# Patient Record
Sex: Female | Born: 1955 | Race: Black or African American | Hispanic: No | Marital: Single | Smoking: Former smoker
Health system: Southern US, Community
[De-identification: ages and names within clinical notes are randomized; demographics above are authoritative.]

## PROBLEM LIST (undated history)

## (undated) DIAGNOSIS — M549 Dorsalgia, unspecified: Secondary | ICD-10-CM

## (undated) DIAGNOSIS — F1990 Other psychoactive substance use, unspecified, uncomplicated: Secondary | ICD-10-CM

## (undated) DIAGNOSIS — F32A Depression, unspecified: Secondary | ICD-10-CM

## (undated) DIAGNOSIS — Z720 Tobacco use: Secondary | ICD-10-CM

## (undated) DIAGNOSIS — F419 Anxiety disorder, unspecified: Secondary | ICD-10-CM

## (undated) DIAGNOSIS — I10 Essential (primary) hypertension: Secondary | ICD-10-CM

## (undated) DIAGNOSIS — J449 Chronic obstructive pulmonary disease, unspecified: Secondary | ICD-10-CM

## (undated) DIAGNOSIS — M2550 Pain in unspecified joint: Secondary | ICD-10-CM

## (undated) DIAGNOSIS — E119 Type 2 diabetes mellitus without complications: Secondary | ICD-10-CM

## (undated) HISTORY — DX: Tobacco use: Z72.0

## (undated) HISTORY — DX: Anxiety disorder, unspecified: F41.9

## (undated) HISTORY — DX: Depression, unspecified: F32.A

## (undated) HISTORY — DX: Chronic obstructive pulmonary disease, unspecified: J44.9

## (undated) HISTORY — PX: CHOLECYSTECTOMY: SHX55

## (undated) HISTORY — DX: Other psychoactive substance use, unspecified, uncomplicated: F19.90

## (undated) HISTORY — PX: VAGINAL HYSTERECTOMY: SUR661

## (undated) HISTORY — DX: Type 2 diabetes mellitus without complications: E11.9

## (undated) HISTORY — DX: Pain in unspecified joint: M25.50

## (undated) HISTORY — DX: Essential (primary) hypertension: I10

## (undated) HISTORY — DX: Dorsalgia, unspecified: M54.9

## (undated) HISTORY — DX: Morbid (severe) obesity due to excess calories: E66.01

---

## 2005-03-09 ENCOUNTER — Ambulatory Visit: Payer: Self-pay | Admitting: Family Medicine

## 2005-03-09 ENCOUNTER — Inpatient Hospital Stay: Payer: Self-pay | Admitting: General Surgery

## 2005-03-15 ENCOUNTER — Ambulatory Visit: Payer: Self-pay | Admitting: General Surgery

## 2006-04-22 ENCOUNTER — Emergency Department: Payer: Self-pay | Admitting: Emergency Medicine

## 2006-09-03 ENCOUNTER — Emergency Department: Payer: Self-pay | Admitting: Emergency Medicine

## 2008-03-06 ENCOUNTER — Emergency Department: Payer: Self-pay | Admitting: Emergency Medicine

## 2009-01-01 ENCOUNTER — Inpatient Hospital Stay: Payer: Self-pay | Admitting: Internal Medicine

## 2009-01-15 ENCOUNTER — Emergency Department: Payer: Self-pay | Admitting: Emergency Medicine

## 2009-03-04 ENCOUNTER — Emergency Department: Payer: Self-pay | Admitting: Internal Medicine

## 2009-03-28 ENCOUNTER — Inpatient Hospital Stay: Payer: Self-pay | Admitting: Internal Medicine

## 2010-02-10 ENCOUNTER — Emergency Department: Payer: Self-pay | Admitting: Internal Medicine

## 2010-03-10 ENCOUNTER — Emergency Department: Payer: Self-pay | Admitting: Emergency Medicine

## 2013-08-17 ENCOUNTER — Emergency Department: Payer: Self-pay | Admitting: Emergency Medicine

## 2013-08-28 ENCOUNTER — Emergency Department: Payer: Self-pay | Admitting: Emergency Medicine

## 2013-08-28 LAB — CBC
HCT: 44.8 % (ref 35.0–47.0)
HGB: 14.4 g/dL (ref 12.0–16.0)
MCH: 30.2 pg (ref 26.0–34.0)
MCHC: 32.3 g/dL (ref 32.0–36.0)
MCV: 94 fL (ref 80–100)
PLATELETS: 359 10*3/uL (ref 150–440)
RBC: 4.79 10*6/uL (ref 3.80–5.20)
RDW: 14.3 % (ref 11.5–14.5)
WBC: 7.9 10*3/uL (ref 3.6–11.0)

## 2013-08-28 LAB — BASIC METABOLIC PANEL
Anion Gap: 7 (ref 7–16)
BUN: 7 mg/dL (ref 7–18)
CO2: 28 mmol/L (ref 21–32)
CREATININE: 1 mg/dL (ref 0.60–1.30)
Calcium, Total: 9 mg/dL (ref 8.5–10.1)
Chloride: 102 mmol/L (ref 98–107)
GLUCOSE: 184 mg/dL — AB (ref 65–99)
Osmolality: 277 (ref 275–301)
Potassium: 3.7 mmol/L (ref 3.5–5.1)
Sodium: 137 mmol/L (ref 136–145)

## 2013-08-28 LAB — TROPONIN I

## 2013-12-28 ENCOUNTER — Inpatient Hospital Stay: Payer: Self-pay | Admitting: Internal Medicine

## 2013-12-28 LAB — CBC WITH DIFFERENTIAL/PLATELET
Basophil #: 0.1 10*3/uL (ref 0.0–0.1)
Basophil %: 0.4 %
EOS PCT: 0 %
Eosinophil #: 0 10*3/uL (ref 0.0–0.7)
HCT: 43.2 % (ref 35.0–47.0)
HGB: 14.1 g/dL (ref 12.0–16.0)
Lymphocyte #: 1.2 10*3/uL (ref 1.0–3.6)
Lymphocyte %: 6.8 %
MCH: 29.6 pg (ref 26.0–34.0)
MCHC: 32.7 g/dL (ref 32.0–36.0)
MCV: 90 fL (ref 80–100)
MONO ABS: 0.9 x10 3/mm (ref 0.2–0.9)
MONOS PCT: 4.8 %
Neutrophil #: 15.6 10*3/uL — ABNORMAL HIGH (ref 1.4–6.5)
Neutrophil %: 88 %
Platelet: 299 10*3/uL (ref 150–440)
RBC: 4.78 10*6/uL (ref 3.80–5.20)
RDW: 13.7 % (ref 11.5–14.5)
WBC: 17.8 10*3/uL — ABNORMAL HIGH (ref 3.6–11.0)

## 2013-12-28 LAB — URINALYSIS, COMPLETE
Bilirubin,UR: NEGATIVE
Glucose,UR: >=500 mg/dL (ref 0–75)
LEUKOCYTE ESTERASE: NEGATIVE
NITRITE: NEGATIVE
Ph: 6 (ref 4.5–8.0)
Protein: 100
RBC,UR: 5 /HPF (ref 0–5)
Specific Gravity: 1.028 (ref 1.003–1.030)
Squamous Epithelial: 1
WBC UR: 5 /HPF (ref 0–5)

## 2013-12-28 LAB — MAGNESIUM: Magnesium: 1.8 mg/dL

## 2013-12-28 LAB — COMPREHENSIVE METABOLIC PANEL
ALK PHOS: 86 U/L
AST: 94 U/L — AB (ref 15–37)
Albumin: 2.3 g/dL — ABNORMAL LOW (ref 3.4–5.0)
Anion Gap: 8 (ref 7–16)
BUN: 23 mg/dL — ABNORMAL HIGH (ref 7–18)
Bilirubin,Total: 0.6 mg/dL (ref 0.2–1.0)
CO2: 28 mmol/L (ref 21–32)
CREATININE: 1.32 mg/dL — AB (ref 0.60–1.30)
Calcium, Total: 8.2 mg/dL — ABNORMAL LOW (ref 8.5–10.1)
Chloride: 101 mmol/L (ref 98–107)
EGFR (African American): 53 — ABNORMAL LOW
GFR CALC NON AF AMER: 44 — AB
GLUCOSE: 334 mg/dL — AB (ref 65–99)
Osmolality: 291 (ref 275–301)
Potassium: 3.4 mmol/L — ABNORMAL LOW (ref 3.5–5.1)
SGPT (ALT): 64 U/L — ABNORMAL HIGH
Sodium: 137 mmol/L (ref 136–145)
TOTAL PROTEIN: 7.1 g/dL (ref 6.4–8.2)

## 2013-12-28 LAB — PHOSPHORUS: Phosphorus: 1.3 mg/dL — ABNORMAL LOW (ref 2.5–4.9)

## 2013-12-28 LAB — TROPONIN I: Troponin-I: 0.07 ng/mL — ABNORMAL HIGH

## 2013-12-28 LAB — PROTIME-INR
INR: 1.2
Prothrombin Time: 14.8 secs — ABNORMAL HIGH (ref 11.5–14.7)

## 2013-12-29 ENCOUNTER — Encounter: Payer: Self-pay | Admitting: Nurse Practitioner

## 2013-12-29 ENCOUNTER — Other Ambulatory Visit: Payer: Self-pay | Admitting: Nurse Practitioner

## 2013-12-29 DIAGNOSIS — I10 Essential (primary) hypertension: Secondary | ICD-10-CM

## 2013-12-29 DIAGNOSIS — R7989 Other specified abnormal findings of blood chemistry: Secondary | ICD-10-CM

## 2013-12-29 LAB — HEPATIC FUNCTION PANEL A (ARMC)
ALT: 91 U/L — AB
Albumin: 2 g/dL — ABNORMAL LOW (ref 3.4–5.0)
Alkaline Phosphatase: 77 U/L
BILIRUBIN DIRECT: 0.2 mg/dL (ref 0.0–0.2)
Bilirubin,Total: 0.4 mg/dL (ref 0.2–1.0)
SGOT(AST): 168 U/L — ABNORMAL HIGH (ref 15–37)
Total Protein: 6.3 g/dL — ABNORMAL LOW (ref 6.4–8.2)

## 2013-12-29 LAB — TROPONIN I
Troponin-I: 0.08 ng/mL — ABNORMAL HIGH
Troponin-I: 0.06 ng/mL — ABNORMAL HIGH

## 2013-12-29 LAB — BASIC METABOLIC PANEL
Anion Gap: 8 (ref 7–16)
BUN: 23 mg/dL — ABNORMAL HIGH (ref 7–18)
CALCIUM: 7.1 mg/dL — AB (ref 8.5–10.1)
CREATININE: 1.24 mg/dL (ref 0.60–1.30)
Chloride: 105 mmol/L (ref 98–107)
Co2: 27 mmol/L (ref 21–32)
EGFR (African American): 57 — ABNORMAL LOW
EGFR (Non-African Amer.): 47 — ABNORMAL LOW
GLUCOSE: 342 mg/dL — AB (ref 65–99)
Osmolality: 297 (ref 275–301)
Potassium: 3.9 mmol/L (ref 3.5–5.1)
SODIUM: 140 mmol/L (ref 136–145)

## 2013-12-29 LAB — CBC WITH DIFFERENTIAL/PLATELET
Basophil #: 0.1 10*3/uL (ref 0.0–0.1)
Basophil %: 0.9 %
Eosinophil #: 0.1 10*3/uL (ref 0.0–0.7)
Eosinophil %: 0.4 %
HCT: 38.4 % (ref 35.0–47.0)
HGB: 12.8 g/dL (ref 12.0–16.0)
Lymphocyte #: 0.8 10*3/uL — ABNORMAL LOW (ref 1.0–3.6)
Lymphocyte %: 4.6 %
MCH: 30.4 pg (ref 26.0–34.0)
MCHC: 33.3 g/dL (ref 32.0–36.0)
MCV: 91 fL (ref 80–100)
MONOS PCT: 3.6 %
Monocyte #: 0.6 x10 3/mm (ref 0.2–0.9)
NEUTROS ABS: 15.2 10*3/uL — AB (ref 1.4–6.5)
Neutrophil %: 90.5 %
Platelet: 261 10*3/uL (ref 150–440)
RBC: 4.2 10*6/uL (ref 3.80–5.20)
RDW: 13.7 % (ref 11.5–14.5)
WBC: 16.8 10*3/uL — ABNORMAL HIGH (ref 3.6–11.0)

## 2013-12-29 LAB — AMMONIA: AMMONIA, PLASMA: 36 umol/L — AB (ref 11–32)

## 2013-12-30 DIAGNOSIS — R0602 Shortness of breath: Secondary | ICD-10-CM

## 2013-12-30 DIAGNOSIS — R7989 Other specified abnormal findings of blood chemistry: Secondary | ICD-10-CM

## 2013-12-30 LAB — CBC WITH DIFFERENTIAL/PLATELET
Basophil #: 0.1 10*3/uL (ref 0.0–0.1)
Basophil %: 0.3 %
Eosinophil #: 0 10*3/uL (ref 0.0–0.7)
Eosinophil %: 0.1 %
HCT: 38.9 % (ref 35.0–47.0)
HGB: 12.7 g/dL (ref 12.0–16.0)
Lymphocyte #: 0.9 10*3/uL — ABNORMAL LOW (ref 1.0–3.6)
Lymphocyte %: 4.8 %
MCH: 29.9 pg (ref 26.0–34.0)
MCHC: 32.5 g/dL (ref 32.0–36.0)
MCV: 92 fL (ref 80–100)
MONOS PCT: 2.8 %
Monocyte #: 0.5 x10 3/mm (ref 0.2–0.9)
Neutrophil #: 16.7 10*3/uL — ABNORMAL HIGH (ref 1.4–6.5)
Neutrophil %: 92 %
Platelet: 299 10*3/uL (ref 150–440)
RBC: 4.23 10*6/uL (ref 3.80–5.20)
RDW: 14.2 % (ref 11.5–14.5)
WBC: 18.2 10*3/uL — ABNORMAL HIGH (ref 3.6–11.0)

## 2013-12-30 LAB — COMPREHENSIVE METABOLIC PANEL
ALK PHOS: 79 U/L
ALT: 101 U/L — AB
ANION GAP: 9 (ref 7–16)
Albumin: 1.9 g/dL — ABNORMAL LOW (ref 3.4–5.0)
BILIRUBIN TOTAL: 0.3 mg/dL (ref 0.2–1.0)
BUN: 26 mg/dL — AB (ref 7–18)
Calcium, Total: 7.4 mg/dL — ABNORMAL LOW (ref 8.5–10.1)
Chloride: 107 mmol/L (ref 98–107)
Co2: 27 mmol/L (ref 21–32)
Creatinine: 1.08 mg/dL (ref 0.60–1.30)
EGFR (African American): >60
EGFR (Non-African Amer.): 55 — ABNORMAL LOW
GLUCOSE: 397 mg/dL — AB (ref 65–99)
OSMOLALITY: 306 (ref 275–301)
Potassium: 3.9 mmol/L (ref 3.5–5.1)
SGOT(AST): 113 U/L — ABNORMAL HIGH (ref 15–37)
SODIUM: 143 mmol/L (ref 136–145)
TOTAL PROTEIN: 6.4 g/dL (ref 6.4–8.2)

## 2013-12-30 LAB — URINE CULTURE

## 2013-12-31 LAB — HEPATIC FUNCTION PANEL A (ARMC)
ALBUMIN: 2.2 g/dL — AB (ref 3.4–5.0)
Alkaline Phosphatase: 82 U/L
BILIRUBIN TOTAL: 0.3 mg/dL (ref 0.2–1.0)
Bilirubin, Direct: <0.1 mg/dL (ref 0.0–0.2)
SGOT(AST): 38 U/L — ABNORMAL HIGH (ref 15–37)
SGPT (ALT): 84 U/L — ABNORMAL HIGH
Total Protein: 6.4 g/dL (ref 6.4–8.2)

## 2013-12-31 LAB — WBC: WBC: 16.8 10*3/uL — ABNORMAL HIGH (ref 3.6–11.0)

## 2014-01-01 LAB — HEMOGLOBIN A1C: Hemoglobin A1C: 10.2 % — ABNORMAL HIGH (ref 4.2–6.3)

## 2014-01-02 ENCOUNTER — Ambulatory Visit: Payer: Medicare Other | Admitting: Cardiovascular Disease

## 2014-01-02 ENCOUNTER — Encounter: Payer: Self-pay | Admitting: *Deleted

## 2014-01-02 LAB — CULTURE, BLOOD (SINGLE)

## 2014-04-16 ENCOUNTER — Emergency Department: Payer: Self-pay | Admitting: Emergency Medicine

## 2014-05-08 LAB — CULTURE, BLOOD (SINGLE)

## 2014-05-23 NOTE — Consult Note (Signed)
Brief Consult Note: Diagnosis: pneumonia.   Patient was seen by consultant.   Consult note dictated.   Comments: Appreciate consult for pleasatn 59 y/o [place] woman with history of COPD/tobacco abuse, DM, obesity, HL, who was admitted for pneumonia, for evaluation of hepatic encephalopathy. NH4 level was found to be 36 (cut off 32). AST and ALT have been 2-3 times normal since adm, with AsT> ALT. Platelets normal. No liver imaging. States her father had alcoholic liver disease, but denies further family hx liv dz. 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 Used to consume some etoh, but none in 1257yr. Unsure if she has had blood trans. [place], [place]  Was incarcerated in past. States no history of liver disease, dialysis, history of viral hepatitis, partners with hepatitis, health care work/military service,frequent use of acetaminophen, herbal supplements. States she had jaundice once, but this was in conjunction with cholecystitis and improved once gallbladder out. Had ERCP 2007 for choledolithiasis by Dr Henrene HawkingSiegel. No hx egd/colonoscopy. States no history of ascites. Is diabetic, not very well controlled. Also obese with HL, so has risk factors for NASH.  States she is feeling well in her abdomen. denies abdominal pain, bloating, dyspepsia, melena/hematochezia, NVD, and all other GI complaints. States she has felt more drowsy and somewhat confused lately but attributes that to her current illness- which has also included sx of fever, cough, sob/doe, chest discomfort. Has had some episodes of hypoxia, and has had a cardiac consult for elevated troponins/chest pain.  Impression and plan. Elevated lfts. Elevated ammonia. Will assess for HCV, HBV, ANA, AMA, ASMA, ebv, cmv. pt/inr. Abdominal US. Avoid liver harming substances. Work for good DM control. ?if some of this is reactive due to current illness, or response to prior abx as o/p recently prescribed by her  PCP- she is unsure what they were. I have put in [facility] to find this out, as this is her PCP. Further recommendations to follow..  Electronic Signatures: Vevelyn PatLondon, Gennavieve Huq H (NP)  (Signed 01-Dec-15 13:19)  Authored: Brief Consult Note   Last Updated: 01-Dec-15 13:19 by Keturah BarreLondon, Starlin Steib H (NP)

## 2014-05-23 NOTE — Consult Note (Signed)
Chief Complaint:  Subjective/Chief Complaint seen for hyperammonemia.  Patient alert, denies abdominal pain or nausea, tolerating po.   VITAL SIGNS/ANCILLARY NOTES: **Vital Signs.:   02-Dec-15 13:41  Vital Signs Type Routine  Temperature Temperature (F) 97.8  Celsius 36.5  Pulse Pulse 105  Respirations Respirations 20  Systolic BP Systolic BP 117  Diastolic BP (mmHg) Diastolic BP (mmHg) 76  Mean BP 89  Pulse Ox % Pulse Ox % 94  Pulse Ox Activity Level  At rest  Oxygen Delivery 2L   Brief Assessment:  GEN obese   Cardiac Regular   Respiratory clear BS   Gastrointestinal details normal Soft  Nontender  Nondistended  Bowel sounds normal   Lab Results: Hepatic:  02-Dec-15 03:48   Bilirubin, Total 0.3  Bilirubin, Direct < 0.1 (Result(s) reported on 31 Dec 2013 at [facility]06:03AM.)  Alkaline Phosphatase 82 (46-116 NOTE: New Reference Range 08/19/13)  SGPT (ALT)  84 (14-63 NOTE: New Reference Range 08/19/13)  SGOT (AST)  38  Total Protein, Serum 6.4  Albumin, Serum  2.2  Routine Hem:  02-Dec-15 03:48   WBC (CBC)  16.8 (Result(s) reported on 31 Dec 2013 at 05:57AM.)   Radiology Results: US:    02-Dec-15 08:35, US Abdomen General Survey  US Abdomen General Survey   REASON FOR EXAM:    elevated lfts  COMMENTS:       PROCEDURE: US  - US ABDOMEN GENERAL SURVEY  - Dec 31 2013  8:35AM     CLINICAL DATA:  Elevated liver function tests.    EXAM:  ULTRASOUND ABDOMEN COMPLETE    COMPARISON:  Intraoperative cholangiogram02/15/2007.    FINDINGS:  Gallbladder: Cholecystectomy.  Common bile duct: Diameter: 4.2 mm, normal.    Liver: Echogenic liver compatible with hepatosteatosis. Increased  echogenicity is visible when compared to the adjacent RIGHT kidney.  No mass lesion or intrahepatic biliary ductal dilation.    IVC: No abnormality visualized.    Pancreas: Visualized portion unremarkable.    Spleen: Size and appearance within normal limits.    Right Kidney: Length:  11.6 cm. Echogenicity within normal limits. No  mass or hydronephrosis visualized.  Left Kidney: Length: 11.8 cm. Echogenicity within normal limits. No  mass or hydronephrosis visualized.    Abdominal aorta: No aneurysm visualized.    Other findings: None.     IMPRESSION:  1. Cholecystectomy.  2.Echogenic liver compatible with hepatosteatosis.      Electronically Signed    By: Andreas NewportGeoffrey  Lamke M.D.    On: 12/31/2013 08:40     Verified By: Wynn BankerGEOFFREY T. LAMKE, M.D.,   Assessment/Plan:  Assessment/Plan:  Assessment 1) abnormal liver tests/mild hyperammonemia-improving/asymptomatic-abd us showing fatty liver.  possiblility of occult liver disease.  Multiple labs have been drawn/pending.   Plan 1) will need o/p fu with GI on lab evaluation in about 2 weeks.  will sign off for now.  reconsult if needed.   Electronic Signatures: Barnetta ChapelSkulskie, Pegi Milazzo (MD)  (Signed 02-Dec-15 15:17)  Authored: Chief Complaint, VITAL SIGNS/ANCILLARY NOTES, Brief Assessment, Lab Results, Radiology Results, Assessment/Plan   Last Updated: 02-Dec-15 15:17 by Barnetta ChapelSkulskie, Jalaya Sarver (MD)

## 2014-05-23 NOTE — H&P (Signed)
PATIENT NAME:  Becky Perry, Becky Perry MR#:  [id number] DATE OF BIRTH:  12-13-1955  DATE OF ADMISSION:  12/28/2013  PRIMARY CARE PHYSICIAN:  Nonlocal  REFERRING PHYSICIAN:  Coolidge Breeze, MD   HISTORY OF PRESENT ILLNESS:  [name] is a 59 year old female with known history of COPD, hypertension, hyperlipidemia, and diabetes mellitus, who had a cough, fever, and shortness of breath about 2 weeks back. The patient went to primary care physician and was given antibiotics. Per patient, the patient's primary care physician told her not to go out. The patient has been staying at home, taking her antibiotics. Last, the patient was seen about 3 days back. When the patient's friend went to visit the patient today, she was found to be extremely confused. She has not gotten out of the bed for the last 3 days. Unable to obtain from the patient, as the patient is an extremely poor historian. She states she has been having cough and shortness of breath. On workup in the Emergency Department, chest x-ray shows interval development of a large airspace opacity seen in the left lower lobe consistent with pneumonia. The patient received levofloxacin in the Emergency Department. The patient is also found to have elevated white blood cell count of 18,000 with a left shift of 88%.   PAST MEDICAL HISTORY: 1.  COPD.  2.  Hypertension.  3.  Diabetes mellitus.  4.  Anxiety disorder.   PAST SURGICAL HISTORY: 1.  Cholecystectomy.  2.  Hysterectomy.   ALLERGIES:  ERYTHROMYCIN AND CODEINE.   HOME MEDICATIONS: 1.  Metformin 1000 mg 2 times a day.  2.  Glipizide 10 mg once a day.  3.  10 mg once a day.  4.  Advair Diskus 250/50 inhalations 2 times a day.   SOCIAL HISTORY:  Smokes 2 packs a day. Denies drinking alcohol or using illicit drugs. Lives by herself.   FAMILY HISTORY:  Mother had COPD.    REVIEW OF SYSTEMS: CONSTITUTIONAL:  Experiencing generalized weakness.  EYES:  No change in vision.  EARS, NOSE, AND  THROAT:  No change in hearing. RESPIRATORY:  Has cough and shortness of breath.  CARDIOVASCULAR:  No chest pain or palpitations.  GASTROINTESTINAL:  No nausea, vomiting, or abdominal pain. GENITOURINARY:  No dysuria or hematuria.  HEMATOLOGIC:  No easy bruising or bleeding.  SKIN:  No rash or lesions.  MUSCULOSKELETAL:  No joint pains and aches.  NEUROLOGIC:  No weakness or numbness in any part of the body.   PHYSICAL EXAMINATION: GENERAL:  This is a well-built, well-nourished, age-appropriate, obese female lying down in the bed, not in distress.  VITAL SIGNS:  Temperature is 100, pulse 97, blood pressure 119/83, respiratory rate 20, and oxygen saturation is 97% on 3 liters of oxygen.  HEENT:  Head is normocephalic and atraumatic. There is no scleral icterus. Conjunctivae are normal. Pupils are equal and react to light. Mucous membranes are moist. No pharyngeal erythema.  NECK:  Supple. No lymphadenopathy. No JVD. No carotid bruit. No thyromegaly.  CHEST:  Has no focal tenderness. Bilateral coarse breath sounds and wheezing.  HEART:  S1 and S2, regular. No murmurs are heard.  ABDOMEN:  Bowel sounds present. Soft, nontender, nondistended. No hepatosplenomegaly. Obese abdomen.  EXTREMITIES:  No pedal edema. Pulses are 2+.  NEUROLOGIC:  The patient is alert and oriented to place and person. Cranial nerves II through XII are intact. Motor is 5/5 in upper and lower extremities.   LABORATORY DATA:  CBC:  WBC of  17.8, hemoglobin 14, and platelet count of 299,000 with neutrophils of 88%.   CMP:  Except for glucose of 334 and creatinine of 1.32, the rest of all the values are within normal limits.   ASSESSMENT AND PLAN:   1.  Pneumonia, community acquired. We will treat with IV levofloxacin. Continue with IV fluids.  2.  Altered mental status. The patient seems to be oriented at this time. We will continue to follow up.  3.  Diabetes mellitus. Hold the metformin. Continue the glipizide.  4.   Continued tobacco use. Counseled with the patient.  5.  Chronic obstructive pulmonary disease exacerbation. Continue with the breathing treatments and Solu-Medrol.  6.  Debility. We will involve physical therapy.  7.  Deep vein thrombosis prophylaxis with Lovenox.   TIME SPENT:  55 minutes.     ____________________________ Susa GriffinsPadmaja Yvette Roark, MD pv:nb D: 12/28/2013 23:26:06 ET T: 12/29/2013 00:13:00 ET JOB#: [id number]  cc: Susa GriffinsPadmaja Shere Eisenhart, MD, <Dictator> Susa GriffinsPADMAJA Adelbert Gaspard MD ELECTRONICALLY SIGNED 01/09/2014 21:30

## 2014-05-23 NOTE — Consult Note (Signed)
Chief Complaint:  Subjective/Chief Complaint Please see full GI consult and brief consult note.   Patietn admitted with PNA, found with mile elevations of hepatocellular enzymes and ammonia.  No asterixis. Patient did have antibiotics pta.  She has soem risk factors for chronic hepatitis.  DDX would include these as well as reactive hepatopathy.   Multiple labs ordered, will obtain abd [place].  Following.   VITAL SIGNS/ANCILLARY NOTES: **Vital Signs.:   01-Dec-15 06:04  Vital Signs Type Routine  Temperature Temperature (F) 97.4  Celsius 36.3  Temperature Source oral  Pulse Pulse 72  Respirations Respirations 20  Systolic BP Systolic BP 425  Diastolic BP (mmHg) Diastolic BP (mmHg) 81  Mean BP 91  Pulse Ox % Pulse Ox % 94  Pulse Ox Activity Level  At rest  Oxygen Delivery 2L  *Intake and Output.:   01-Dec-15 12:00  Stool  large stool, somewhat ;loose    14:00  Stool  large stool, loose and formed   Brief Assessment:  GEN obese   Cardiac Regular   Respiratory wheezing  rhonchi   Gastrointestinal details normal Nontender  Nondistended  Bowel sounds normal   Lab Results: LabObservation:  01-Dec-15 08:56   OBSERVATION Reason for Test  Hepatic:  29-Nov-15 19:52   Bilirubin, Total 0.6  Alkaline Phosphatase 86 (46-116 NOTE: New Reference Range 08/19/13)  SGPT (ALT)  64 (14-63 NOTE: New Reference Range 08/19/13)  SGOT (AST)  94  Total Protein, Serum 7.1  Albumin, Serum  2.3  30-Nov-15 03:51   Bilirubin, Total 0.4  Alkaline Phosphatase 77 (46-116 NOTE: New Reference Range 08/19/13)  SGPT (ALT)  91 (14-63 NOTE: New Reference Range 08/19/13)  SGOT (AST)  168  Total Protein, Serum  6.3  Albumin, Serum  2.0  Bilirubin, Direct 0.2 (Result(s) reported on 29 Dec 2013 at 08:38AM.)  01-Dec-15 03:54   Bilirubin, Total 0.3  Alkaline Phosphatase 79 (46-116 NOTE: New Reference Range 08/19/13)  SGPT (ALT)  101 (14-63 NOTE: New Reference Range 08/19/13)  SGOT (AST)  113   Total Protein, Serum 6.4  Albumin, Serum  1.9  Routine Chem:  30-Nov-15 17:01   Ammonia, Plasma  36 (Result(s) reported on 29 Dec 2013 at 05:39PM.)  01-Dec-15 03:54   Glucose, Serum  397  BUN  26  Creatinine (comp) 1.08  Sodium, Serum 143  Potassium, Serum 3.9  Chloride, Serum 107  CO2, Serum 27  Calcium (Total), Serum  7.4  Osmolality (calc) 306  eGFR (African American) >60  eGFR (Non-African American)  55 (eGFR values <109m/min/1.73 m2 may be an indication of chronic kidney disease (CKD). Calculated eGFR, using the MRDR Study equation, is useful in  patients with stable renal function. The eGFR calculation will not be reliable in acutely ill patients when serum creatinine is changing rapidly. It is not useful in patients on dialysis. The eGFR calculation may not be applicable to patients at the low and high extremes of body sizes, pregnant women, and vegetarians.)  Anion Gap 9  Routine Hem:  01-Dec-15 03:54   WBC (CBC)  18.2  RBC (CBC) 4.23  Hemoglobin (CBC) 12.7  Hematocrit (CBC) 38.9  Platelet Count (CBC) 299  MCV 92  MCH 29.9  MCHC 32.5  RDW 14.2  Neutrophil % 92.0  Lymphocyte % 4.8  Monocyte % 2.8  Eosinophil % 0.1  Basophil % 0.3  Neutrophil #  16.7  Lymphocyte #  0.9  Monocyte # 0.5  Eosinophil # 0.0  Basophil # 0.1 (Result(s) reported on 30 Dec 2013 at [facility].)   Radiology Results: XRay:    29-Nov-15 19:40, Chest Portable Single View  Chest Portable Single View   REASON FOR EXAM:    Sepsis  COMMENTS:       PROCEDURE: DXR - DXR PORTABLE CHEST SINGLE VIEW  - Dec 28 2013  7:40PM     CLINICAL DATA:  Chest pain.    EXAM:  PORTABLE CHEST - 1 VIEW    COMPARISON:  August 28, 2013.    FINDINGS:  Right lung is clear. Interval development of large airspace opacity  in left lower lobe consistent with pneumonia. No pneumothorax is  noted. Some degree of pleural effusion on the left cannot be  excluded. Bony thorax is intact.      IMPRESSION:  Interval development of large airspace opacity seen in left lower  lobe consistent with pneumonia. Followup radiographs are  recommended.      Electronically Signed    By: Sabino Dick M.D.    On: 12/28/2013 20:04         Verified By: Marveen Reeks, M.D.,   Electronic Signatures: Loistine Simas (MD)  (Signed 01-Dec-15 15:32)  Authored: Chief Complaint, VITAL SIGNS/ANCILLARY NOTES, Brief Assessment, Lab Results, Radiology Results   Last Updated: 01-Dec-15 15:32 by Loistine Simas (MD)

## 2014-05-23 NOTE — Consult Note (Signed)
General Aspect Primary Cardiologist: New - seen by M. Fletcher Anon, MD Primary Care Provider: [facility]  _____________  Pt Profile:  59 y/o female w/o prior cardiac hx who presented to the ED yesterday with dyspnea and CAP, whom we've been asked to eval 2/2 mild troponin elevation. _____________   Past Medical History ??? Essential hypertension  ??? Morbid obesity  ??? Diabetes mellitus type II, controlled  ??? Tobacco abuse    a. 11/2013 smoking 2ppd ??? COPD (chronic obstructive pulmonary disease)  ??? Anxiety   Past Surgical History ??? Cholecystectomy   ??? Vaginal hysterectomy   _____________  Family History ??? COPD Mother    alive in her 16's ??? Cancer Father    died in his 63's - heroine addict. _____________  Social History ??? Marital Status: Single  ??? Smoking status: Current Every Day Smoker -- 2.00 packs/day for 30 years   Types: Cigarettes ??? Smokeless tobacco: Not on file ??? Alcohol Use: No ??? 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 ??? Sexual Activity: Not on file  Social History Narrative  Lives by herself in [place].  Originally from [place], [place].  Does not routinely exercise. _____________   Present Illness 59 y/o female w/o a prior cardiac hx.  She does have a h/o DM, HTN, obesity, and ongoing tobacco abuse complicated by CAD.  She has never seen cardiology.  She lives locally by herself and is followed in the [facility].  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    In the setting of ongoing tobacco abuse and COPD, she reports frequent bouts of respiratory infxns and COPD exacerbations requiring abx and/or steroids.  Last Monday, she began to note increasing dyspnea with cough and saw her PCP.  She was placed on a steroid burst.  Unfortunately, she did not feel any better and by  Wed of last week, she noted a constant, dull, fullness in her lower chest that was not worse with deep breathing, coughing, palpation, exertion, or position changes.  She saw her PCP again and was placed on abx.  She continued to feel poorly over the weekend with intermittent fevers, chills, cough, congestion, and dyspnea.  Yesterday, a friend came to her home after her family couldn't get in touch with her and found her dyspneic and confused.  EMS was called and she was taken to [facility].  Here, cxr showed large lll pna.  WBC elevated.  She was admitted by medicine and placed on IV abx.  We've been asked to eval 2/2 mild trop elevations of 0.07->0.08->0.06.  She currently denies c/p or dyspnea. _____________  Family History ??? COPD Mother    alive in her 55's ??? Cancer Father    died in his 43's - heroine addict. _____________  Social History ??? Marital Status: Single  ??? Smoking status: Current Every Day Smoker -- 2.00 packs/day for 30 years   Types: Cigarettes ??? Smokeless tobacco: Not on file ??? Alcohol Use: No ??? 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 ??? Sexual Activity: Not on file  Social History Narrative  Lives by herself in [place].  Originally from [place], [place].  Does not routinely exercise. _____________   Physical Exam:  GEN well developed, well nourished, no acute distress   HEENT  pink conjunctivae, moist oral mucosa   NECK supple  obese, difficult to assess jvp, no bruits.   RESP normal resp effort  markedly diminished breath sounds bilat with exp wheezing throughout.   CARD Regular rate and rhythm  Normal, S1, S2  No murmur   ABD denies tenderness  soft  normal BS   LYMPH negative neck   EXTR negative cyanosis/clubbing, negative edema   SKIN normal to palpation, No rashes   NEURO cranial nerves intact, cranial nerves deficit   PSYCH alert, A+O to time, place, person   Review of Systems:  General:  Fatigue  Fever/chills  Weakness   Skin: No Complaints   ENT: No Complaints   Eyes: No Complaints   Neck: No Complaints   Respiratory: Frequent cough  Short of breath  Wheezing   Cardiovascular: Chest pain or discomfort  Dyspnea   Gastrointestinal: No Complaints   Genitourinary: No Complaints   Vascular: No Complaints   Musculoskeletal: No Complaints   Neurologic: No Complaints   Hematologic: No Complaints   Endocrine: No Complaints   Psychiatric: No Complaints   Review of Systems: All other systems were reviewed and found to be negative   Medications/Allergies Reviewed Medications/Allergies reviewed   Home Medications: Medication Instructions Status  metFORMIN 1000 mg oral tablet 1 tab(s) orally 2 times a day Active  glipiZIDE 10 mg oral tablet 1 tab(s) orally once a day Active  cetirizine 10 mg oral tablet 1 tab(s) orally once a day Active  Advair Diskus 250 mcg-50 mcg inhalation powder 1 puff(s) inhaled 2 times a day Active   Lab Results:  Hepatic:  30-Nov-15 03:51   Bilirubin, Total 0.4  Bilirubin, Direct 0.2 (Result(s) reported on 29 Dec 2013 at 08:38AM.)  Alkaline Phosphatase 77 (46-116 NOTE: New Reference Range 08/19/13)  SGPT (ALT)  91 (14-63 NOTE: New Reference Range 08/19/13)  SGOT (AST)  168  Total Protein, Serum  6.3  Albumin, Serum  2.0  Routine Chem:  30-Nov-15 00:22   Result Comment TROPONIN - RESULTS VERIFIED BY REPEAT TESTING.  - PREV. C/ 12-28-13 @2030  BY CAF.Marland KitchenAJO  Result(s) reported on 29 Dec 2013 at 01:17AM.    03:51   Result Comment TROPONIN - RESULTS VERIFIED BY REPEAT TESTING.  - PREV. C/ 12-28-13 @2030  BY CAF.Marland KitchenAJO  Result(s) reported on 29 Dec 2013 at 04:23AM.  Glucose, Serum  342  BUN  23  Creatinine (comp) 1.24  Sodium, Serum 140  Potassium, Serum 3.9  Chloride, Serum 105  CO2, Serum 27  Calcium (Total), Serum  7.1  Anion Gap 8  Osmolality (calc) 297  eGFR (African American)  57  eGFR (Non-African American)  47 (eGFR  values <76m/min/1.73 m2 may be an indication of chronic kidney disease (CKD). Calculated eGFR, using the MRDR Study equation, is useful in  patients with stable renal function. The eGFR calculation will not be reliable in acutely ill patients when serum creatinine is changing rapidly. It is not useful in patients on dialysis. The eGFR calculation may not be applicable to patients at the low and high extremes of body sizes, pregnant women, and vegetarians.)  Cardiac:  30-Nov-15 00:22   Troponin I  0.08 (0.00-0.05 0.05 ng/mL or less: NEGATIVE  Repeat testing in 3-6 hrs  if clinically indicated. >0.05 ng/mL: POTENTIAL  MYOCARDIAL INJURY. Repeat  testing in 3-6 hrs if  clinically indicated. NOTE: An increase or decrease  of 30% or more on serial  testing suggests a  clinically important change)    03:51  Troponin I  0.06 (0.00-0.05 0.05 ng/mL or less: NEGATIVE  Repeat testing in 3-6 hrs  if clinically indicated. >0.05 ng/mL: POTENTIAL  MYOCARDIAL INJURY. Repeat  testing in 3-6 hrs if  clinically indicated. NOTE: An increase or decrease  of 30% or more on serial  testing suggests a  clinically important change)  Routine Hem:  30-Nov-15 03:51   WBC (CBC)  16.8  RBC (CBC) 4.20  Hemoglobin (CBC) 12.8  Hematocrit (CBC) 38.4  Platelet Count (CBC) 261  MCV 91  MCH 30.4  MCHC 33.3  RDW 13.7  Neutrophil % 90.5  Lymphocyte % 4.6  Monocyte % 3.6  Eosinophil % 0.4  Basophil % 0.9  Neutrophil #  15.2  Lymphocyte #  0.8  Monocyte # 0.6  Eosinophil # 0.1  Basophil # 0.1 (Result(s) reported on 29 Dec 2013 at 04:14AM.)   EKG:  Interpretation sinus tach, 118, left axis dev.   Radiology Results: XRay:    29-Nov-15 19:40, Chest Portable Single View  Chest Portable Single View   REASON FOR EXAM:    Sepsis  COMMENTS:       PROCEDURE: DXR - DXR PORTABLE CHEST SINGLE VIEW  - Dec 28 2013  7:40PM     CLINICAL DATA:  Chest pain.    EXAM:  PORTABLE CHEST - 1  VIEW    COMPARISON:  August 28, 2013.    FINDINGS:  Right lung is clear. Interval development of large airspace opacity  in left lower lobe consistent with pneumonia. No pneumothorax is  noted. Some degree of pleural effusion on the left cannot be  excluded. Bony thorax is intact.     IMPRESSION:  Interval development of large airspace opacity seen in left lower  lobe consistent with pneumonia. Followup radiographs are  recommended.      Electronically Signed    By: Sabino Dick M.D.    On: 12/28/2013 20:04         Verified By: Marveen Reeks, M.D.,    Codeine: Unknown  Erythromycin: GI Distress  Vital Signs/Nurse's Notes: **Vital Signs.:   30-Nov-15 05:15  Vital Signs Type Routine  Temperature Temperature (F) 97.9  Celsius 36.6  Temperature Source oral  Pulse Pulse 89  Respirations Respirations 20  Systolic BP Systolic BP 212  Diastolic BP (mmHg) Diastolic BP (mmHg) 71  Mean BP 82  Pulse Ox % Pulse Ox % 92  Pulse Ox Activity Level  At rest  Oxygen Delivery 2L    Impression 1.  Troponin Elevation:  Mild troponin elevation with flat trend and w/o evidence of rise of at least 20% above prior values - thus non-specific.  She has had persistent lower sternal chest discomfort since Wednesday in the setting of CAP.  Agree with echo.  Provided that EF nl w/o wma's, she would not require further inpt ischemic evaluation.  That said, with her multiple RF for CAD (HTN, Obesity, DM, and Tob Abuse), she would benefit from outpt ischemic evaluation/myoview.  Add ASA 81.  Despite DM, would not add statin currently in the setting of mild LFT abnormalities.  2.  LLL CAP:  Abx per IM.  3.  Acute exacerbation of COPD:  Inhalers/steroids per IM.  4.  Tob Abuse:  Complete cessation advised.  She hopes to start on chantix.  5.  HTN:  Stable.   Electronic Signatures for Addendum Section:  Kathlyn Sacramento (MD) (Signed Addendum [id number] 18:31)  The patient was seen and examined. Agree  with the above. elevated  TnI is likely due to supply demand ischemia. consider outpatient stress test.   Electronic Signatures: Kathlyn Sacramento (MD)  (Signed [phone] 18:31)  Co-Signer: General Aspect/Present Illness, History and Physical Exam, Review of System, Home Medications, Labs, EKG , Radiology, Allergies, Vital Signs/Nurse's Notes, Impression/Plan Rogelia Mire (NP)  (Signed [phone] 13:33)  Authored: General Aspect/Present Illness, History and Physical Exam, Review of System, Home Medications, Labs, EKG , Radiology, Allergies, Vital Signs/Nurse's Notes, Impression/Plan   Last Updated: 30-Nov-15 18:31 by Kathlyn Sacramento (MD)

## 2014-05-23 NOTE — Discharge Summary (Signed)
PATIENT NAME:  Becky Perry, Becky Perry MR#:  [id number] DATE OF BIRTH:  [birth year]  DATE OF ADMISSION:  12/28/2013 DATE OF DISCHARGE: 01/01/2014   ADMITTING DIAGNOSIS: Altered mental status.   DISCHARGE DIAGNOSES:  1.  Left upper as well as left lower lobe pneumonia due to unknown infectious organism.  2.  Chronic obstructive pulmonary disease exacerbation due to pneumonia.  3.  Acute respiratory failure with hypoxia.  4. Ongoing tobacco abuse.  5.  Acute renal insufficiency, likely due to dehydration, resolved.  6.  Elevated transaminases, likely fatty liver infiltration.  7.  Hepatic and metabolic encephalopathy due to above.  8.  Elevated troponin, likely demand ischemia. No acute coronary syndrome. 9.  Diabetes mellitus type 2, insulin-dependent with hemoglobin A1c 10.2.  10.  Obesity.  11.  Hypertension.  12.  Anxiety.   DISCHARGE CONDITION: Stable.   DISCHARGE MEDICATIONS:  1.  The patient is to continue metformin 1 gram twice daily.  2.  Glipizide 10 mg p.o. daily. 3.  Cetirizine 10 mg p.o. daily. 4.  Advair Diskus 250/50 one puff twice daily.  5.  Prednisone 60 mg p.o. once on 01/02/2014, then taper by 10 mg daily until stopped.  6.  Insulin Lantus, new medication 25 units subcutaneously at bedtime.  7.  Nystatin 100,000 units in the morning mL oral suspension, 5 mL every 6 hours as needed.  8.  Aspirin 81 mg p.o. daily.  9.  Albuterol ipratropium 2.5/0.5 mg in 3 mL inhalation solution 6 times daily as needed.  10.  Tiotropium 1 inhalation daily.  11.  Guaifenesin 600 mg p.o. twice daily.  12.  Lactulose 30 mL twice daily.  13.  Nicotine transdermal patch 21 mg topically daily.  14.  Levaquin 500 mg p.o. daily for 6 more days.   HOME OXYGEN: None.   DIET: Two gram salt, low-fat, low-cholesterol, carbohydrate-controlled diet, regular consistency.   ACTIVITY LIMITATIONS: As tolerated.   REFERRAL: To home health, physical therapy as well as R.N.   FOLLOWUP APPOINTMENT:  With [facility] in 2 days after discharge and [facility] in 2 days after discharge.  CONSULTANTS: Care management; social work; cardiologist, Marykay Lex, MD, Antonieta Iba, MD; Ok Anis, NP; gastroenterologist, Christena Deem, MD  RADIOLOGIC STUDIES: Chest x-ray, 12/28/2013 single portable: Interval development of large airspace opacity seen in left lower lobe consistent with pneumonia, follow up radiographs are recommended. Ultrasound of abdomen general survey, 12/31/2013 showed cholecystectomy, echogenic liver compatible with hepatosteatosis. Repeated chest x-ray PA and lateral 01/01/2014 revealed improved appearance of left-sided pneumonia. Additional followup films in 10 days to 2 weeks are recommended assuming the patient is continuing to improve clinically.   HOSPITAL COURSE: The patient is a 59 year old African American female with past medical history significant for history of diabetes mellitus who was on oral medications at home, presents to the hospital with complaints of cough, fevers, shortness of breath, which has gone on for approximately 2 weeks now. She was seen by primary care physician and was given antibiotic therapy, however, was found to be confused and was brought to Emergency Room where she was noted to have elevated white blood cell count as well as hypoxia. Her oxygen saturations were 97% on 3 liters of oxygen through nasal cannula. Her temperature was 100, pulse was 97, blood pressure 119/83 on arrival to the Emergency Room. Physical examination revealed bilateral coarse breath sounds as well as wheezing. Chest x-ray revealed pneumonia. The patient was admitted to the hospital for  further evaluation. She was started on broad-spectrum antibiotic therapy. Initial laboratories done on arrival to the Emergency Room 12/28/2013 revealed elevated BUN and creatinine to 23 and 1.32, glucose level of 334, potassium of 3.4, otherwise BMP was unremarkable. The  patient's phosphorus level was found to be 1.3, magnesium 1.8, calcium 8.2. Liver enzymes showed elevation of AST as well as ALT to 94 and 64 respectively. Albumin level of 2.3. Troponin was elevated at 0.07 on the first set, 0.08 on the second and 0.06 on the third set. White blood cell count was elevated to 17.8, hemoglobin was 14.1, platelet count was 299,000, absolute neutrophil count was 15.6. Coagulation panel revealed pro time 14.8 and INR was 1.2. Urinalysis was remarkable for 5 red blood cells and 5 white blood cells, 1+ bacteria. The patient's EKG showed sinus tachycardia at 118 beats per minute, possible left atrial enlargement, LVH per EKG criteria. Lactic acid level was 1.9. Chest x-ray showed pneumonia as mentioned above. The patient was admitted to the hospital for further evaluation. She was initiated on broad-spectrum antibiotic therapy and her condition was followed along. She was also noted to have COPD exacerbation for which she was initiated on steroids, inhalation therapy as well as nebulizers. With all medications, her condition improved and her oxygenation also improved. However, she was quite difficult to wean off oxygen completely. Her oxygen saturations on exertion would drop down to 87% on room air and we were not able to discharge her over the past few days. However, on 01/01/2014, her oxygen saturations were stable at 92% to 96% on room air at rest as well as on exertion and it was felt that she is ready to be discharged home. She is to continue antibiotic therapy for 6 more days to complete course. She is to continue steroid therapy and inhalation therapy for her COPD exacerbation. She is to follow up with her primary physician for further recommendations.   In regard to ongoing tobacco abuse, she was counseled and nicotine replacement therapy was initiated for her upon discharge.   In regard to acute renal insufficiency, she was given IV fluids and her kidney function normalized.    In regard to elevated transaminases, It was unclear why she had elevation of transaminases, gastroenterology consultation was requested. Ultrasound of her abdomen was performed, which revealed fatty liver infiltration. Multiple laboratory studies were taken and all of them were negative. Those included hepatitis B surface antibody, mitochondrial antibody, anti-smooth muscle antibody, antinuclear antibody, hepatitis B surface antigen, hepatitis A antibody, hepatitis B core antibody, hepatitis C virus antibody, cytomegalovirus PCR and super panel EBV acute antibodies.   Regarding the patient's altered mental status, the patient's ammonia level was checked and was found to be elevated. It was felt that the patient's altered mental status was very likely due to hepatic as well as metabolic encephalopathy.  For elevated troponin, the patient was consulted by cardiologist who felt the patient very likely has demand ischemia, but not acute coronary syndrome. He recommended to follow up with him as an outpatient. A [facility] consultation as outpatient will be arranged for her upon discharge.  For diabetes mellitus, patient's diabetes was poorly controlled. Hemoglobin A1c was found to be 10.2. The patient was advised to start insulin therapy; however, she was very reluctant. It is unclear if in fact she will comply with those recommendations.  For her history of obesity, hypertension and anxiety, the patient was advised to continue her outpatient medications and lose weight.   The  patient is being discharged in stable condition with the above-mentioned medications and followup. On the day of discharge, temperature was 97.8, pulse was 95, respiration rate was 20, blood pressure 137/85, saturation was 92% to 96% on room air at rest as well as on exertion.   TIME SPENT: Forty minutes on the patient.    ____________________________ Katharina Caper, MD rv:TT D: 01/01/2014 17:41:06 ET T: 01/01/2014  18:23:19 ET JOB#: [id number]  cc: Katharina Caper, MD, <Dictator> Phineas Real [facility] MD ELECTRONICALLY SIGNED 01/06/2014 12:03

## 2014-05-27 NOTE — Consult Note (Signed)
PATIENT NAME:  Becky Perry, Becky Perry MR#:  [id number] DATE OF BIRTH:  [birth year]  DATE OF CONSULTATION:  12/30/2013  REFERRING PHYSICIAN:  GI ordered by Winona LegatoVaickute for evaluation of hepatic encephalopathy. CONSULTING PHYSICIAN:  Keturah Barrehristiane H. London, NP  HISTORY OF PRESENT ILLNESS: I appreciate consult for a pleasant 59 year old [place] woman with history of COPD, tobacco abuse, diabetes, obesity, hyperlipidemia, who was admitted for pneumonia and possible SIRS for evaluation of hepatic encephalopathy. Ammonia level was found to be 36, cutoff of 32. AST, ALT has been 2-3 times normal since admission with AST greater than ALT. Platelets normal. No liver imaging. PT, INR stable. States her father had alcoholic liver disease, but denies further family history of liver disease. States she used some intranasal cocaine about 30 years ago.  Used to consume some alcohol, but none in 10 years. Unsure if she has had blood transfusion. [place]  Was incarcerated in past. States no history of liver disease, dialysis, history of viral hepatitis, partners with hepatitis, healthcare work, Financial plannermilitary service, frequent use of acetaminophen or herbal supplements. States she had jaundice once, but this was in conjunction with cholecystitis and improved once gallbladder was out. Had ERCP 2007 for choledocholithiasis by Dr. Henrene HawkingSiegel. No history of EGD or colonoscopy. States no history of ascites. Is diabetic, not very well controlled. Also obese  with hyperlipidemia/htn, so has risk factors for NASH. States she is feeling well in her abdomen. Denies abdominal pain, bloating, dyspepsia, melena, hematochezia, nausea, vomiting and all other complaints. States she has felt more drowsy and somewhat confused lately, but attributes this to her current illness, which has also included symptoms of fever, cough, shortness of breath, dyspnea on exertion, and chest discomfort. Has had some episodes of  hypoxia. Had had a cardiac consult for elevated troponins and chest pain.   PAST MEDICAL HISTORY: COPD, hypertension, diabetes, anxiety disorder, cholecystectomy, tubal ligation, hysterectomy.   ALLERGIES: ERYTHROMYCIN, CODEINE.   HOME MEDICATIONS:  Metformin 1000 mg b.i.d., glipizide 10 mg daily, cetirizine 10 mg daily, Advair Diskus 250/50 b.i.d., unknown blood pressure pill once a day, unknown diuretic once a day. Of note, has been on prior antibiotics written by her primary provider for her respiratory infection prior to admission. She does not know what these are.   SOCIAL HISTORY: Smokes 2 packs of cigarettes a day. No current EtOH. No current illicits. Lives alone.   FAMILY HISTORY: Mother with COPD. There is no GI family history.  REVIEW OF SYSTEMS: Ten systems reviewed, unremarkable other than what is noted above.   LABORATORY DATA: Most recent labs, glucose 397, BUN 26, creatinine 1.08, sodium 143, potassium 3.9, GFR 60, calcium 7.4, ammonia 36, total protein 6.3, albumin 1.9, total bilirubin 0.3, ALP 79, AST 113, ALT 101. WBC 18.2, hemoglobin 12.7, hematocrit 38.9, platelet count 299,000. Red cells normocytic.   PHYSICAL EXAMINATION:  MOST RECENT VITAL SIGNS: Temperature 97.4, pulse 72, respiratory rate 20, blood pressure 113/81, SaO2 88%-92% on room air.  GENERAL: Chronically ill-appearing woman in no acute distress, calm, sitting on the edge of the bed.  HEENT: Normocephalic, atraumatic. Sclerae clear, conjunctivae pink.  NECK: Supple. No JVD or lymphadenopathy.  CHEST: Respirations eupneic.  LUNGS: Diminished sounds in the bases bilaterally.  CARDIAC: S1, S2, RRR. No MRG.  ABDOMEN: Obese abdomen. Bowel sounds x 4. Soft, nontender, nondistended. Unable to appreciate hepatosplenomegaly or masses. No guarding, rigidity, peritoneal signs or other abnormalities.  EXTREMITIES: Moves all extremities well x 4. Strength 5/5. No  clubbing or cyanosis.  SKIN: Warm, dry, pink. No erythema,  lesion or rash.  NEUROLOGIC: Alert, oriented x 3. Cranial nerves II through XII intact. Speech clear. No facial droop, although she is somewhat slow to respond. There is no asterixis.  PSYCHIATRIC: Pleasant, calm, cooperative.   IMPRESSION AND PLAN: Elevated liver function tests, elevated ammonia. We will assess for hepatitis C, hepatitis B, ANA, AMA, ASMA, EBV, CMV, PT-INR, abdominal ultrasound.  Avoid liver harming substances. Work for good diabetes control. Question if some of this is reactive due to current illness or in response to prior antibiotic therapy as outpatient. I have put a call in for pharmacy at [facility] to find this out and left a message for them to return call as this is her primary care office. Further recommendations to follow.   Thank you very much for this consult.   These services were provided by Vevelyn Pat, MSN, [facility], in collaboration with Christena Deem, MD, with whom I have discussed this patient in full.    ____________________________ Keturah Barre, NP chl:LT D: 12/30/2013 16:37:00 ET Perry: 12/30/2013 17:06:31 ET JOB#: [id number]  cc: Keturah Barre, NP, <Dictator> Eustaquio Maize LONDON FNP ELECTRONICALLY SIGNED 02/03/2014 14:08

## 2014-06-02 ENCOUNTER — Ambulatory Visit: Admit: 2014-06-02 | Payer: Medicare Other | Admitting: Gastroenterology

## 2014-06-02 SURGERY — COLONOSCOPY
Anesthesia: General

## 2014-07-30 ENCOUNTER — Ambulatory Visit: Admission: RE | Admit: 2014-07-30 | Payer: Medicare Other | Source: Ambulatory Visit | Admitting: Gastroenterology

## 2014-07-30 ENCOUNTER — Encounter: Admission: RE | Payer: Self-pay | Source: Ambulatory Visit

## 2014-07-30 SURGERY — COLONOSCOPY WITH PROPOFOL
Anesthesia: General

## 2014-11-20 ENCOUNTER — Encounter: Payer: Self-pay | Admitting: Adult Health

## 2014-11-20 ENCOUNTER — Emergency Department
Admission: EM | Admit: 2014-11-20 | Discharge: 2014-11-21 | Disposition: A | Discharge status: Home / Self Care | Payer: Medicare Other | Attending: Emergency Medicine | Admitting: Emergency Medicine

## 2014-11-20 ENCOUNTER — Emergency Department: Payer: Medicare Other

## 2014-11-20 DIAGNOSIS — J441 Chronic obstructive pulmonary disease with (acute) exacerbation: Secondary | ICD-10-CM | POA: Insufficient documentation

## 2014-11-20 DIAGNOSIS — Z720 Tobacco use: Secondary | ICD-10-CM | POA: Insufficient documentation

## 2014-11-20 DIAGNOSIS — E119 Type 2 diabetes mellitus without complications: Secondary | ICD-10-CM | POA: Diagnosis not present

## 2014-11-20 DIAGNOSIS — Z79899 Other long term (current) drug therapy: Secondary | ICD-10-CM | POA: Diagnosis not present

## 2014-11-20 DIAGNOSIS — R0602 Shortness of breath: Secondary | ICD-10-CM | POA: Diagnosis present

## 2014-11-20 DIAGNOSIS — Z7951 Long term (current) use of inhaled steroids: Secondary | ICD-10-CM | POA: Diagnosis not present

## 2014-11-20 DIAGNOSIS — J209 Acute bronchitis, unspecified: Secondary | ICD-10-CM

## 2014-11-20 DIAGNOSIS — I10 Essential (primary) hypertension: Secondary | ICD-10-CM | POA: Insufficient documentation

## 2014-11-20 LAB — BASIC METABOLIC PANEL
ANION GAP: 6 (ref 5–15)
BUN: 12 mg/dL (ref 6–20)
CHLORIDE: 104 mmol/L (ref 101–111)
CO2: 28 mmol/L (ref 22–32)
Calcium: 9.3 mg/dL (ref 8.9–10.3)
Creatinine, Ser: 1.18 mg/dL — ABNORMAL HIGH (ref 0.44–1.00)
GFR, EST AFRICAN AMERICAN: 57 mL/min — AB (ref >60)
GFR, EST NON AFRICAN AMERICAN: 49 mL/min — AB (ref >60)
Glucose, Bld: 128 mg/dL — ABNORMAL HIGH (ref 65–99)
POTASSIUM: 3.8 mmol/L (ref 3.5–5.1)
SODIUM: 138 mmol/L (ref 135–145)

## 2014-11-20 LAB — CBC
HEMATOCRIT: 42 % (ref 35.0–47.0)
HEMOGLOBIN: 14.1 g/dL (ref 12.0–16.0)
MCH: 30.1 pg (ref 26.0–34.0)
MCHC: 33.5 g/dL (ref 32.0–36.0)
MCV: 89.9 fL (ref 80.0–100.0)
Platelets: 363 10*3/uL (ref 150–440)
RBC: 4.67 MIL/uL (ref 3.80–5.20)
RDW: 13.7 % (ref 11.5–14.5)
WBC: 8.2 10*3/uL (ref 3.6–11.0)

## 2014-11-20 MED ORDER — ALBUTEROL SULFATE (2.5 MG/3ML) 0.083% IN NEBU
5.0000 mg | INHALATION_SOLUTION | Freq: Once | RESPIRATORY_TRACT | Status: AC
  Administered 2014-11-20: 5 mg via RESPIRATORY_TRACT
  Filled 2014-11-20: qty 6

## 2014-11-20 NOTE — ED Notes (Signed)
Presents with productive cough-clear mucus, SOB and cramping all over body. Bilateral breath sounds diminished. Pt is able to speak in full sentences, Sats 94%.

## 2014-11-21 LAB — TROPONIN I: Troponin I: <0.03 ng/mL (ref <0.031)

## 2014-11-21 LAB — BRAIN NATRIURETIC PEPTIDE: B NATRIURETIC PEPTIDE 5: 6 pg/mL (ref 0.0–100.0)

## 2014-11-21 MED ORDER — PREDNISONE 20 MG PO TABS: 60.0000 mg | ORAL_TABLET | Freq: Every day | ORAL | Status: DC

## 2014-11-21 MED ORDER — AMOXICILLIN-POT CLAVULANATE 875-125 MG PO TABS: 1.0000 | ORAL_TABLET | Freq: Two times a day (BID) | ORAL | Status: AC

## 2014-11-21 MED ORDER — METHYLPREDNISOLONE SODIUM SUCC 125 MG IJ SOLR
125.0000 mg | Freq: Once | INTRAMUSCULAR | Status: AC
  Administered 2014-11-21: 125 mg via INTRAVENOUS
  Filled 2014-11-21: qty 2

## 2014-11-21 MED ORDER — IPRATROPIUM-ALBUTEROL 0.5-2.5 (3) MG/3ML IN SOLN
RESPIRATORY_TRACT | Status: AC
  Administered 2014-11-21: 6 mL via RESPIRATORY_TRACT
  Filled 2014-11-21: qty 6

## 2014-11-21 MED ORDER — IPRATROPIUM-ALBUTEROL 0.5-2.5 (3) MG/3ML IN SOLN
6.0000 mL | Freq: Once | RESPIRATORY_TRACT | Status: AC
  Administered 2014-11-21: 6 mL via RESPIRATORY_TRACT

## 2014-11-21 MED ORDER — AMOXICILLIN-POT CLAVULANATE 875-125 MG PO TABS
1.0000 | ORAL_TABLET | Freq: Once | ORAL | Status: AC
  Administered 2014-11-21: 1 via ORAL
  Filled 2014-11-21: qty 1

## 2014-11-21 MED ORDER — NICOTINE 10 MG IN INHA
1.0000 | RESPIRATORY_TRACT | Status: DC | PRN
  Administered 2014-11-21: 1 via RESPIRATORY_TRACT
  Filled 2014-11-21: qty 36

## 2014-11-21 NOTE — ED Notes (Signed)
Pt came out into the hall wanting to go outside to go smoke.  Escorted her back to her room and going to ask MD for nicotine patch if available.

## 2014-11-21 NOTE — Discharge Instructions (Signed)

## 2014-11-21 NOTE — ED Provider Notes (Signed)
[facility] Emergency Department Provider Note  ____________________________________________  Time seen: 1:20 AM  I have reviewed the triage vital signs and the nursing notes.   HISTORY  Chief Complaint Shortness of Breath     HPI Becky MaduraSharon T Perry is a 59 y.o. female presents with productive cough shortness of breath and generalized body aches 2 weeks. Patient states that she smokes approximately 2 packs of cigarettes per day which is down from 3 packs per day. Patient states that she has a history of COPD last exacerbation was approximately one year ago. Patient states with the change in temperature she usually has difficulty. She denies any fever. Patient denies any chest pain.    Past Medical History  Diagnosis Date  . Essential hypertension   . Morbid obesity (HCC)   . Diabetes mellitus type II, controlled (HCC)   . Tobacco abuse     a. 11/2013 smoking 2ppd  . COPD (chronic obstructive pulmonary disease) (HCC)   . Anxiety     There are no active problems to display for this patient.   Past Surgical History  Procedure Laterality Date  . Cholecystectomy    . Vaginal hysterectomy      Current Outpatient Rx  Name  Route  Sig  Dispense  Refill  . albuterol (PROVENTIL HFA;VENTOLIN HFA) 108 (90 BASE) MCG/ACT inhaler   Inhalation   Inhale 2 puffs into the lungs every 6 (six) hours as needed for wheezing or shortness of breath.         . Fluticasone-Salmeterol (ADVAIR) 250-50 MCG/DOSE AEPB   Inhalation   Inhale 1 puff into the lungs 2 (two) times daily.         . hydrochlorothiazide (HYDRODIURIL) 25 MG tablet   Oral   Take 25 mg by mouth daily.         Marland Kitchen. ipratropium (ATROVENT HFA) 17 MCG/ACT inhaler   Inhalation   Inhale 2 puffs into the lungs every 6 (six) hours as needed for wheezing.         Marland Kitchen. ipratropium-albuterol (DUONEB) 0.5-2.5 (3) MG/3ML SOLN   Nebulization   Take 3 mLs by nebulization.         Marland Kitchen. lisinopril  (PRINIVIL,ZESTRIL) 10 MG tablet   Oral   Take 10 mg by mouth daily.         . metFORMIN (GLUCOPHAGE) 500 MG tablet   Oral   Take by mouth daily with breakfast.           Allergies Codeine sulfate and Erythromycin  Family History  Problem Relation Age of Onset  . COPD Mother     alive in her 9080's  . Cancer Father     died in his 760's - heroine addict.    Social History Social History  Substance Use Topics  . Smoking status: Current Every Day Smoker -- 2.00 packs/day for 30 years    Types: Cigarettes  . Smokeless tobacco: None  . Alcohol Use: No    Review of Systems  Constitutional: Negative for fever. Eyes: Negative for visual changes. ENT: Negative for sore throat. Cardiovascular: Negative for chest pain. Respiratory: Positive for shortness of breath and cough Gastrointestinal: Negative for abdominal pain, vomiting and diarrhea. Genitourinary: Negative for dysuria. Musculoskeletal: Negative for back pain. Skin: Negative for rash. Neurological: Negative for headaches, focal weakness or numbness.   10-point ROS otherwise negative.  ____________________________________________   PHYSICAL EXAM:  VITAL SIGNS: ED Triage Vitals  Enc Vitals Group     BP  11/20/14 2143 131/83 mmHg     Pulse Rate 11/20/14 2143 106     Resp 11/20/14 2143 22     Temp 11/20/14 2143 98.5 F (36.9 C)     Temp Source 11/20/14 2143 Oral     SpO2 11/20/14 2143 94 %     Weight 11/20/14 2351 310 lb (140.615 kg)     Height 11/20/14 2351  (1.727 m)     Head Cir --      Peak Flow --      Pain Score 11/20/14 2143 6     Pain Loc --      Pain Edu? --      Excl. in GC? --     Constitutional: Alert and oriented. Well appearing and in no distress. Eyes: Conjunctivae are normal. PERRL. Normal extraocular movements. ENT   Head: Normocephalic and atraumatic.   Nose: No congestion/rhinnorhea.   Mouth/Throat: Mucous membranes are moist.   Neck: No  stridor. Hematological/Lymphatic/Immunilogical: No cervical lymphadenopathy. Cardiovascular: Normal rate, regular rhythm. Normal and symmetric distal pulses are present in all extremities. No murmurs, rubs, or gallops. Respiratory: Normal respiratory effort without tachypnea nor retractions. Breath sounds are clear and equal bilaterally. Moderate wheezes noted on auscultation diffusely. Gastrointestinal: Soft and nontender. No distention. There is no CVA tenderness. Genitourinary: deferred Musculoskeletal: Nontender with normal range of motion in all extremities. No joint effusions.  No lower extremity tenderness nor edema. Neurologic:  Normal speech and language. No gross focal neurologic deficits are appreciated. Speech is normal.  Skin:  Skin is warm, dry and intact. No rash noted. Psychiatric: Mood and affect are normal. Speech and behavior are normal. Patient exhibits appropriate insight and judgment.  ____________________________________________    LABS (pertinent positives/negatives)  Labs Reviewed  BASIC METABOLIC PANEL - Abnormal; Notable for the following:    Glucose, Bld 128 (*)    Creatinine, Ser 1.18 (*)    GFR calc non Af Amer 49 (*)    GFR calc Af Amer 57 (*)    All other components within normal limits  CBC  TROPONIN I  BRAIN NATRIURETIC PEPTIDE       RADIOLOGY  DG Chest 2 View (Final result) Result time: 11/20/14 23:20:12   Final result by Rad Results In Interface (11/20/14 23:20:12)   Narrative:   CLINICAL DATA: Acute onset of shortness of breath, productive cough and abdominal cramping. Initial encounter.  EXAM: CHEST 2 VIEW  COMPARISON: Chest radiograph performed 04/16/2014, and CTA of the chest performed 04/17/2014  FINDINGS: The lungs are well-aerated. Mild vascular congestion is noted. There is no evidence of focal opacification, pleural effusion or pneumothorax.  The heart is normal in size; the mediastinal contour is within normal  limits. No acute osseous abnormalities are seen.  IMPRESSION: Mild vascular congestion noted. Lungs remain grossly clear.   Electronically Signed By: Roanna Raider M.D. On: 11/20/2014 23:20            INITIAL IMPRESSION / ASSESSMENT AND PLAN / ED COURSE  Pertinent labs & imaging results that were available during my care of the patient were reviewed by me and considered in my medical decision making (see chart for details).  History and physical exam consistent with acute bronchitis. Chest x-ray revealed no evidence of pneumonia. Patient received 2 DuoNeb's Solu-Medrol 125 mg with stated improvement in symptoms. Wheezes noted on initial auscultation cleared at this point. ____________________________________________   FINAL CLINICAL IMPRESSION(S) / ED DIAGNOSES  Final diagnoses:  Acute bronchitis, unspecified organism  Darci Current, MD 11/21/14 [phone]

## 2014-11-21 NOTE — ED Notes (Signed)
Attempted IV insertion RAC, unsuccessful.  Asking Becky Perry to [place]

## 2014-12-27 ENCOUNTER — Encounter: Payer: Self-pay | Admitting: Emergency Medicine

## 2014-12-27 ENCOUNTER — Emergency Department
Admission: EM | Admit: 2014-12-27 | Discharge: 2014-12-27 | Disposition: A | Discharge status: Home / Self Care | Payer: Medicare Other | Attending: Emergency Medicine | Admitting: Emergency Medicine

## 2014-12-27 ENCOUNTER — Emergency Department: Payer: Medicare Other

## 2014-12-27 DIAGNOSIS — E669 Obesity, unspecified: Secondary | ICD-10-CM | POA: Insufficient documentation

## 2014-12-27 DIAGNOSIS — Z7951 Long term (current) use of inhaled steroids: Secondary | ICD-10-CM | POA: Insufficient documentation

## 2014-12-27 DIAGNOSIS — Z7984 Long term (current) use of oral hypoglycemic drugs: Secondary | ICD-10-CM | POA: Insufficient documentation

## 2014-12-27 DIAGNOSIS — F17210 Nicotine dependence, cigarettes, uncomplicated: Secondary | ICD-10-CM | POA: Diagnosis not present

## 2014-12-27 DIAGNOSIS — Z79899 Other long term (current) drug therapy: Secondary | ICD-10-CM | POA: Insufficient documentation

## 2014-12-27 DIAGNOSIS — J441 Chronic obstructive pulmonary disease with (acute) exacerbation: Secondary | ICD-10-CM | POA: Insufficient documentation

## 2014-12-27 DIAGNOSIS — R0602 Shortness of breath: Secondary | ICD-10-CM | POA: Diagnosis present

## 2014-12-27 DIAGNOSIS — I10 Essential (primary) hypertension: Secondary | ICD-10-CM | POA: Insufficient documentation

## 2014-12-27 DIAGNOSIS — E119 Type 2 diabetes mellitus without complications: Secondary | ICD-10-CM | POA: Diagnosis not present

## 2014-12-27 LAB — BASIC METABOLIC PANEL
ANION GAP: 6 (ref 5–15)
BUN: 16 mg/dL (ref 6–20)
CO2: 30 mmol/L (ref 22–32)
Calcium: 9.3 mg/dL (ref 8.9–10.3)
Chloride: 108 mmol/L (ref 101–111)
Creatinine, Ser: 1.2 mg/dL — ABNORMAL HIGH (ref 0.44–1.00)
GFR calc Af Amer: 56 mL/min — ABNORMAL LOW (ref >60)
GFR calc non Af Amer: 48 mL/min — ABNORMAL LOW (ref >60)
GLUCOSE: 135 mg/dL — AB (ref 65–99)
POTASSIUM: 4.2 mmol/L (ref 3.5–5.1)
Sodium: 144 mmol/L (ref 135–145)

## 2014-12-27 LAB — CBC
HCT: 41.5 % (ref 35.0–47.0)
HEMOGLOBIN: 13.7 g/dL (ref 12.0–16.0)
MCH: 30.2 pg (ref 26.0–34.0)
MCHC: 32.9 g/dL (ref 32.0–36.0)
MCV: 91.6 fL (ref 80.0–100.0)
Platelets: 357 10*3/uL (ref 150–440)
RBC: 4.53 MIL/uL (ref 3.80–5.20)
RDW: 14.1 % (ref 11.5–14.5)
WBC: 8.5 10*3/uL (ref 3.6–11.0)

## 2014-12-27 LAB — TROPONIN I: Troponin I: <0.03 ng/mL (ref <0.031)

## 2014-12-27 MED ORDER — PREDNISONE 20 MG PO TABS
ORAL_TABLET | ORAL | Status: AC
  Administered 2014-12-27: 60 mg via ORAL
  Filled 2014-12-27: qty 3

## 2014-12-27 MED ORDER — IPRATROPIUM-ALBUTEROL 0.5-2.5 (3) MG/3ML IN SOLN
3.0000 mL | Freq: Once | RESPIRATORY_TRACT | Status: AC
  Administered 2014-12-27: 3 mL via RESPIRATORY_TRACT
  Filled 2014-12-27 (×2): qty 3

## 2014-12-27 MED ORDER — ALBUTEROL SULFATE HFA 108 (90 BASE) MCG/ACT IN AERS: INHALATION_SPRAY | RESPIRATORY_TRACT | Status: AC

## 2014-12-27 MED ORDER — IPRATROPIUM-ALBUTEROL 0.5-2.5 (3) MG/3ML IN SOLN
3.0000 mL | Freq: Once | RESPIRATORY_TRACT | Status: AC
  Administered 2014-12-27: 3 mL via RESPIRATORY_TRACT

## 2014-12-27 MED ORDER — IPRATROPIUM-ALBUTEROL 0.5-2.5 (3) MG/3ML IN SOLN
RESPIRATORY_TRACT | Status: AC
  Administered 2014-12-27: 3 mL via RESPIRATORY_TRACT
  Filled 2014-12-27: qty 6

## 2014-12-27 MED ORDER — PREDNISONE 20 MG PO TABS
60.0000 mg | ORAL_TABLET | ORAL | Status: AC
  Administered 2014-12-27: 60 mg via ORAL

## 2014-12-27 MED ORDER — PREDNISONE 20 MG PO TABS: 60.0000 mg | ORAL_TABLET | Freq: Every day | ORAL | Status: DC

## 2014-12-27 NOTE — Discharge Instructions (Signed)
We believe that your symptoms are caused today by an exacerbation of your COPD, and possibly bronchitis.  Please take the prescribed medications and any medications that you have at home for your COPD.  Follow up with your doctor as recommended.  If you develop any new or worsening symptoms, including but not limited to fever, persistent vomiting, worsening shortness of breath, or other symptoms that concern you, please return to the Emergency Department immediately. ° ° °Chronic Obstructive Pulmonary Disease °Chronic obstructive pulmonary disease (COPD) is a common lung condition in which airflow from the lungs is limited. COPD is a general term that can be used to describe many different lung problems that limit airflow, including both chronic bronchitis and emphysema.  If you have COPD, your lung function will probably never return to normal, but there are measures you can take to improve lung function and make yourself feel better.  °CAUSES  °· Smoking (common).   °· Exposure to secondhand smoke.   °· Genetic problems. °· Chronic inflammatory lung diseases or recurrent infections. °SYMPTOMS  °· Shortness of breath, especially with physical activity.   °· Deep, persistent (chronic) cough with a large amount of thick mucus.   °· Wheezing.   °· Rapid breaths (tachypnea).   °· Gray or bluish discoloration (cyanosis) of the skin, especially in fingers, toes, or lips.   °· Fatigue.   °· Weight loss.   °· Frequent infections or episodes when breathing symptoms become much worse (exacerbations).   °· Chest tightness. °DIAGNOSIS  °Your health care provider will take a medical history and perform a physical examination to make the initial diagnosis.  Additional tests for COPD may include:  °· Lung (pulmonary) function tests. °· Chest X-ray. °· CT scan. °· Blood tests. °TREATMENT  °Treatment available to help you feel better when you have COPD includes:  °· Inhaler and nebulizer medicines. These help manage the symptoms of  COPD and make your breathing more comfortable. °· Supplemental oxygen. Supplemental oxygen is only helpful if you have a low oxygen level in your blood.   °· Exercise and physical activity. These are beneficial for nearly all people with COPD. Some people may also benefit from a pulmonary rehabilitation program. °HOME CARE INSTRUCTIONS  °· Take all medicines (inhaled or pills) as directed by your health care provider. °· Avoid over-the-counter medicines or cough syrups that dry up your airway (such as antihistamines) and slow down the elimination of secretions unless instructed otherwise by your health care provider.   °· If you are a smoker, the most important thing that you can do is stop smoking. Continuing to smoke will cause further lung damage and breathing trouble. Ask your health care provider for help with quitting smoking. He or she can direct you to community resources or hospitals that provide support. °· Avoid exposure to irritants such as smoke, chemicals, and fumes that aggravate your breathing. °· Use oxygen therapy and pulmonary rehabilitation if directed by your health care provider. If you require home oxygen therapy, ask your health care provider whether you should purchase a pulse oximeter to measure your oxygen level at home.   °· Avoid contact with individuals who have a contagious illness. °· Avoid extreme temperature and humidity changes. °· Eat healthy foods. Eating smaller, more frequent meals and resting before meals may help you maintain your strength. °· Stay active, but balance activity with periods of rest. Exercise and physical activity will help you maintain your ability to do things you want to do. °· Preventing infection and hospitalization is very important when you have COPD. Make sure   to receive all the vaccines your health care provider recommends, especially the pneumococcal and influenza vaccines. Ask your health care provider whether you need a pneumonia vaccine. °· Learn  and use relaxation techniques to manage stress. °· Learn and use controlled breathing techniques as directed by your health care provider. Controlled breathing techniques include:   °¨ Pursed lip breathing. Start by breathing in (inhaling) through your nose for 1 second. Then, purse your lips as if you were going to whistle and breathe out (exhale) through the pursed lips for 2 seconds.   °¨ Diaphragmatic breathing. Start by putting one hand on your abdomen just above your waist. Inhale slowly through your nose. The hand on your abdomen should move out. Then purse your lips and exhale slowly. You should be able to feel the hand on your abdomen moving in as you exhale.   °· Learn and use controlled coughing to clear mucus from your lungs. Controlled coughing is a series of short, progressive coughs. The steps of controlled coughing are:   °1. Lean your head slightly forward.   °2. Breathe in deeply using diaphragmatic breathing.   °3. Try to hold your breath for 3 seconds.   °4. Keep your mouth slightly open while coughing twice.   °5. Spit any mucus out into a tissue.   °6. Rest and repeat the steps once or twice as needed. °SEEK MEDICAL CARE IF:  °· You are coughing up more mucus than usual.   °· There is a change in the color or thickness of your mucus.   °· Your breathing is more labored than usual.   °· Your breathing is faster than usual.   °SEEK IMMEDIATE MEDICAL CARE IF:  °· You have shortness of breath while you are resting.   °· You have shortness of breath that prevents you from: °¨ Being able to talk.   °¨ Performing your usual physical activities.   °· You have chest pain lasting longer than 5 minutes.   °· Your skin color is more cyanotic than usual. °· You measure low oxygen saturations for longer than 5 minutes with a pulse oximeter. °MAKE SURE YOU:  °· Understand these instructions. °· Will watch your condition. °· Will get help right away if you are not doing well or get worse. °Document Released:  10/26/2004 Document Revised: 06/02/2013 Document Reviewed: 09/12/2012 °[employer]® Patient Information ©2015 [employer]. This information is not intended to replace advice given to you by your health care provider. Make sure you discuss any questions you have with your health care provider. ° ° ° °

## 2014-12-27 NOTE — ED Notes (Signed)
Pt reports feeling short of breath for a couple of days. Patient has minimal difficulty speaking in full sentences.  Shortness of breath at rest and increases with exertion.

## 2014-12-27 NOTE — ED Provider Notes (Signed)
[facility] Emergency Department Provider Note  ____________________________________________  Time seen: Approximately 6:48 PM  I have reviewed the triage vital signs and the nursing notes.   HISTORY  Chief Complaint Shortness of Breath    HPI Becky MCGLAUN is a 59 y.o. female with a history of COPD, obesity, diabetes, and continued tobacco abuse who presents with about 3 days of gradual onset worsening difficulty breathing and wheezing.  She states that it feels similar to her prior COPD exacerbations or bronchitis episodes.  She denies chest pain, fever/chills, abdominal pain, nausea/vomiting, dysuria, headache.  She states that her inhalers help a little bit but nothing makes it go away.  The symptoms are frequently worse in the morning.Overall they are moderate to severe.  Exertion makes the symptoms worse.   Past Medical History  Diagnosis Date  . Essential hypertension   . Morbid obesity (HCC)   . Diabetes mellitus type II, controlled (HCC)   . Tobacco abuse     a. 11/2013 smoking 2ppd  . COPD (chronic obstructive pulmonary disease) (HCC)   . Anxiety     There are no active problems to display for this patient.   Past Surgical History  Procedure Laterality Date  . Cholecystectomy    . Vaginal hysterectomy      Current Outpatient Rx  Name  Route  Sig  Dispense  Refill  . albuterol (PROVENTIL HFA;VENTOLIN HFA) 108 (90 BASE) MCG/ACT inhaler      Inhale 4-6 puffs by mouth every 4 hours as needed for wheezing, cough, and/or shortness of breath   1 Inhaler   1   . Fluticasone-Salmeterol (ADVAIR) 250-50 MCG/DOSE AEPB   Inhalation   Inhale 1 puff into the lungs 2 (two) times daily.         . hydrochlorothiazide (HYDRODIURIL) 25 MG tablet   Oral   Take 25 mg by mouth daily.         Marland Kitchen ipratropium (ATROVENT HFA) 17 MCG/ACT inhaler   Inhalation   Inhale 2 puffs into the lungs every 6 (six) hours as needed for wheezing.         Marland Kitchen  ipratropium-albuterol (DUONEB) 0.5-2.5 (3) MG/3ML SOLN   Nebulization   Take 3 mLs by nebulization.         Marland Kitchen lisinopril (PRINIVIL,ZESTRIL) 10 MG tablet   Oral   Take 10 mg by mouth daily.         . metFORMIN (GLUCOPHAGE) 500 MG tablet   Oral   Take by mouth daily with breakfast.         . predniSONE (DELTASONE) 20 MG tablet   Oral   Take 3 tablets (60 mg total) by mouth daily.   15 tablet   0     Allergies Codeine sulfate and Erythromycin  Family History  Problem Relation Age of Onset  . COPD Mother     alive in her 92's  . Cancer Father     died in his 64's - heroine addict.    Social History Social History  Substance Use Topics  . Smoking status: Current Every Day Smoker -- 2.00 packs/day for 30 years    Types: Cigarettes  . Smokeless tobacco: Becky Perry  . Alcohol Use: No    Review of Systems Constitutional: No fever/chills Eyes: No visual changes. ENT: No sore throat. Cardiovascular: Denies chest pain. Respiratory: Red onset worsening shortness of breath and wheezing with frequent dry cough Gastrointestinal: No abdominal pain.  No nausea, no vomiting.  No diarrhea.  No constipation. Genitourinary: Negative for dysuria. Musculoskeletal: Negative for back pain. Skin: Negative for rash. Neurological: Negative for headaches, focal weakness or numbness.  10-point ROS otherwise negative.  ____________________________________________   PHYSICAL EXAM:  VITAL SIGNS: ED Triage Vitals  Enc Vitals Group     BP 12/27/14 1721 136/95 mmHg     Pulse Rate 12/27/14 1721 103     Resp 12/27/14 1844 18     Temp 12/27/14 1721 98.1 F (36.7 C)     Temp Source 12/27/14 1721 Oral     SpO2 12/27/14 1721 95 %     Weight --      Height --      Head Cir --      Peak Flow --      Pain Score 12/27/14 1638 0     Pain Loc --      Pain Edu? --      Excl. in GC? --     Constitutional: Alert and oriented. Well appearing and in no acute distress. Eyes: Conjunctivae are  normal. PERRL. EOMI. Head: Atraumatic. Nose: No congestion/rhinnorhea. Mouth/Throat: Mucous membranes are moist.  Oropharynx non-erythematous. Neck: No stridor.   Cardiovascular: Normal rate, regular rhythm. Grossly normal heart sounds.  Good peripheral circulation. Respiratory: Mildly increased respiratory effort.  No retractions.  Expiratory wheezing throughout, improved after the initial DuoNeb Gastrointestinal: Obese, Soft and nontender. No distention. No abdominal bruits. No CVA tenderness. Musculoskeletal: No lower extremity tenderness nor edema.  No joint effusions. Neurologic:  Normal speech and language. No gross focal neurologic deficits are appreciated.  Skin:  Skin is warm, dry and intact. No rash noted. Psychiatric: Mood and affect are normal. Speech and behavior are normal.  ____________________________________________   LABS (all labs ordered are listed, but only abnormal results are displayed)  Labs Reviewed  BASIC METABOLIC PANEL - Abnormal; Notable for the following:    Glucose, Bld 135 (*)    Creatinine, Ser 1.20 (*)    GFR calc non Af Amer 48 (*)    GFR calc Af Amer 56 (*)    All other components within normal limits  CBC  TROPONIN I   ____________________________________________  EKG  ED ECG REPORT I, Brailyn Delman, the attending physician, personally viewed and interpreted this ECG.  Date: 12/27/2014 EKG Time: 16:40 Rate: 103 Rhythm: Sinus tachycardia QRS Axis: normal Intervals: normal ST/T Wave abnormalities: normal Conduction Disutrbances: Becky Perry Narrative Interpretation: unremarkable  ____________________________________________  RADIOLOGY   Dg Chest 2 View  12/27/2014  CLINICAL DATA:  History of bronchitis. Smoker. Increasing shortness of breath over the past 2-3 days. Initial encounter. EXAM: CHEST  2 VIEW COMPARISON:  PA and lateral chest 11/20/2014.  CT chest 04/17/2014. FINDINGS: Heart size is normal. The lungs are emphysematous and  there is peribronchial thickening. No consolidative process, pneumothorax or effusion. No focal bony abnormality. IMPRESSION: Changes of COPD without acute disease. Electronically Signed   By: Drusilla Kannerhomas  Dalessio M.D.   On: 12/27/2014 17:55    ____________________________________________   PROCEDURES  Procedure(s) performed: Becky Perry  Critical Care performed: No ____________________________________________   INITIAL IMPRESSION / ASSESSMENT AND PLAN / ED COURSE  Pertinent labs & imaging results that were available during my care of the patient were reviewed by me and considered in my medical decision making (see chart for details).  Signs and symptoms appear consistent with uncomplicated COPD exacerbation.  Patient feels better after treatment in the emergency department.  Usual per prescription and therapy and follow-up plan.  I  gave my usual and customary return precautions.     ____________________________________________  FINAL CLINICAL IMPRESSION(S) / ED DIAGNOSES  Final diagnoses:  COPD with exacerbation (HCC)      NEW MEDICATIONS STARTED DURING THIS VISIT:  New Prescriptions   ALBUTEROL (PROVENTIL HFA;VENTOLIN HFA) 108 (90 BASE) MCG/ACT INHALER    Inhale 4-6 puffs by mouth every 4 hours as needed for wheezing, cough, and/or shortness of breath   PREDNISONE (DELTASONE) 20 MG TABLET    Take 3 tablets (60 mg total) by mouth daily.     Loleta Rose, MD 12/27/14 [phone]

## 2014-12-27 NOTE — ED Notes (Signed)
Patient presents to the ED with increased shortness of breath for the past 2-3 days.  Patient reports chronic COPD.  Patient is having some difficulty speaking in full sentences.

## 2014-12-27 NOTE — ED Notes (Signed)
Patient transported to X-ray 

## 2014-12-28 ENCOUNTER — Telehealth: Payer: Self-pay | Admitting: Emergency Medicine

## 2014-12-28 NOTE — ED Notes (Signed)
[facility] [address] called to clarify proventil inhaler dosage.  Per dr Cyril Loosenkinner can take 2-4 puffs every 4 hours as needed.

## 2015-08-02 IMAGING — CT CT ANGIO CHEST
2 of 6 series · 18 of 36 positions shown · IV contrast (APPLIED)
Comparison: Radiographs 04/16/2014

CLINICAL DATA: Worsening cough and dyspnea

EXAM:
CT ANGIOGRAPHY CHEST WITH CONTRAST
TECHNIQUE: Multidetector CT imaging of the chest was performed using the
standard protocol during bolus administration of intravenous
contrast. Multiplanar CT image reconstructions and MIPs were
obtained to evaluate the vascular anatomy.
CONTRAST:  100 mL Omnipaque 350 intravenous

[Series 5: pe 1.0 thins · axial · 0.85mm/px · z∈[-465,-207]mm · 17 of 292 slices shown]
[im 17/292  lung]
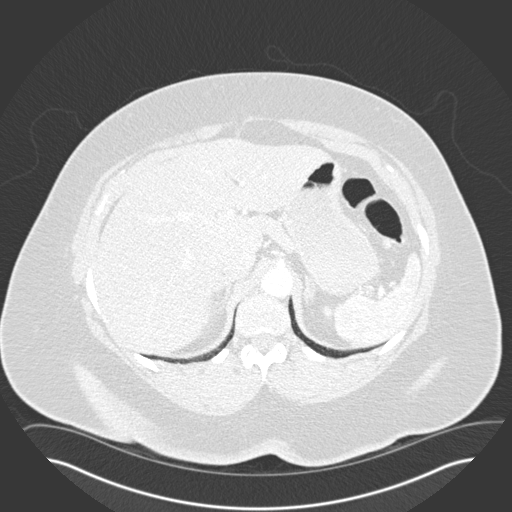
[im 33/292  mediastinal]
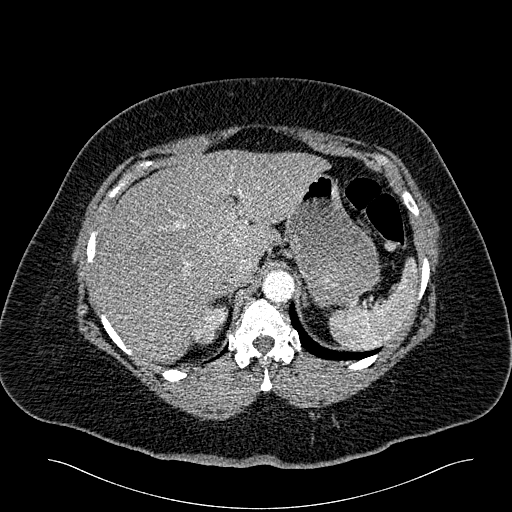
[im 49/292  lung]
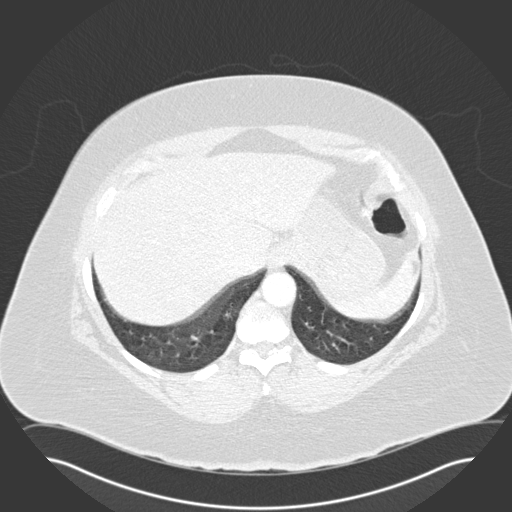
[im 65/292  mediastinal]
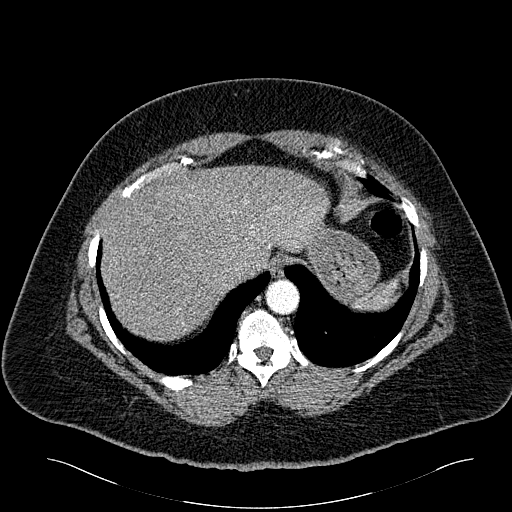
[im 81/292  lung]
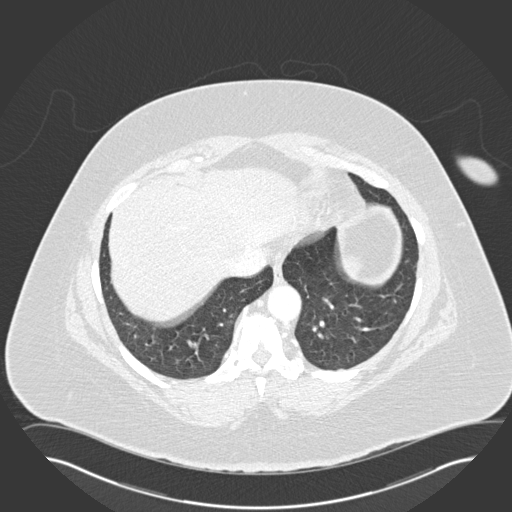
[im 98/292  mediastinal]
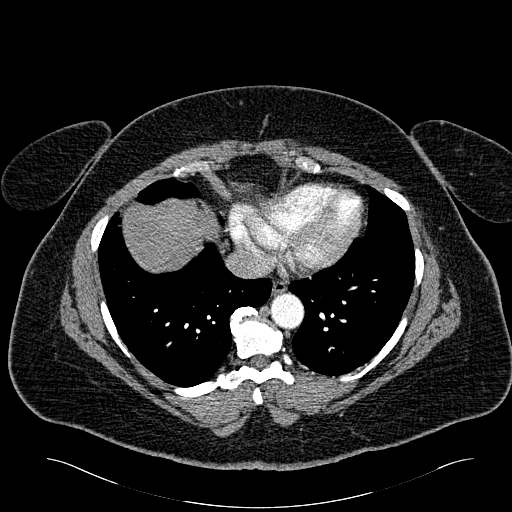
[im 114/292  lung]
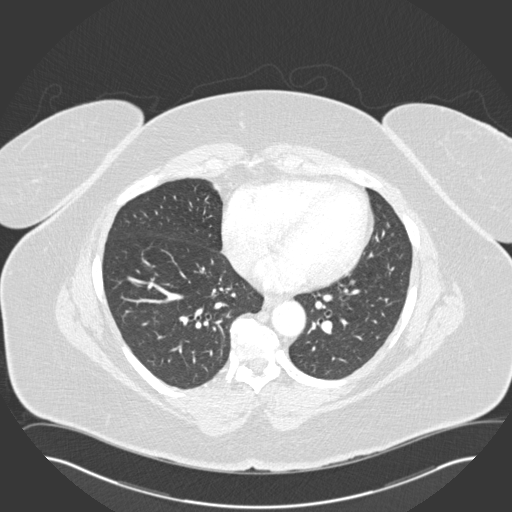
[im 130/292  mediastinal]
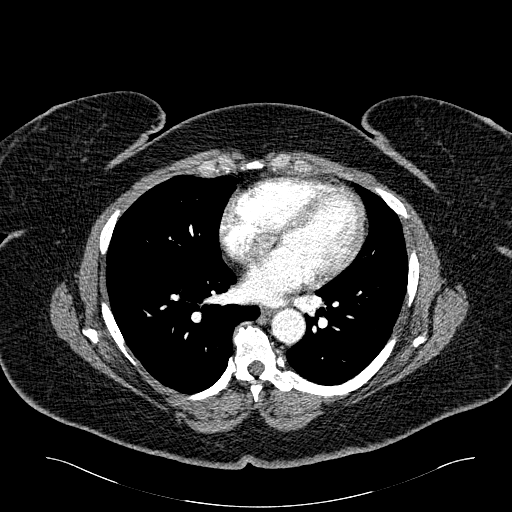
[im 146/292  lung]
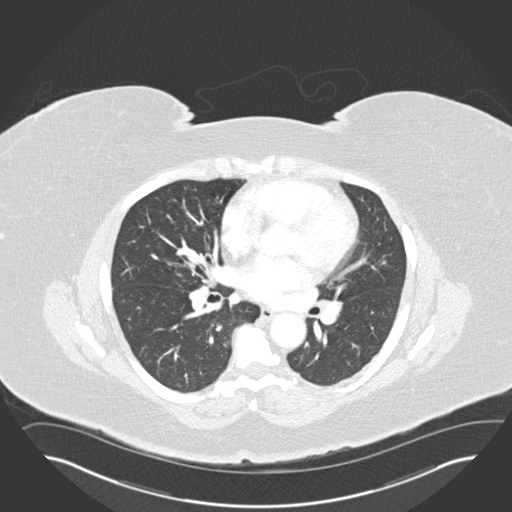
[im 162/292  mediastinal]
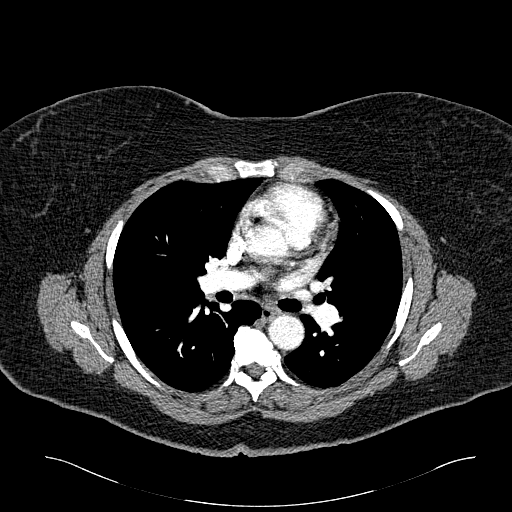
[im 178/292  lung]
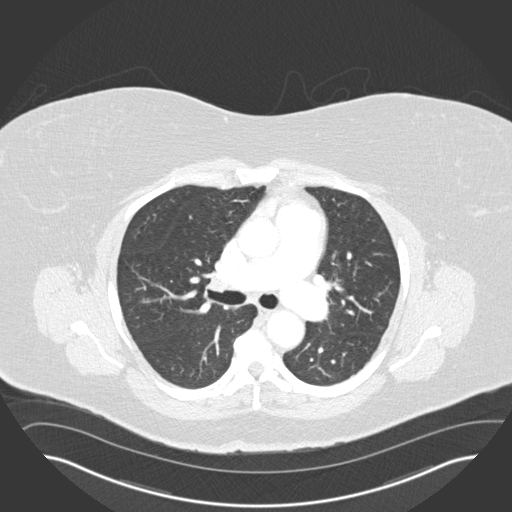
[im 195/292  mediastinal]
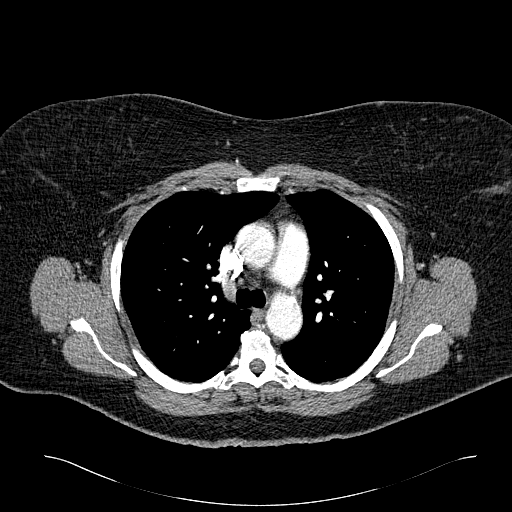
[im 211/292  lung]
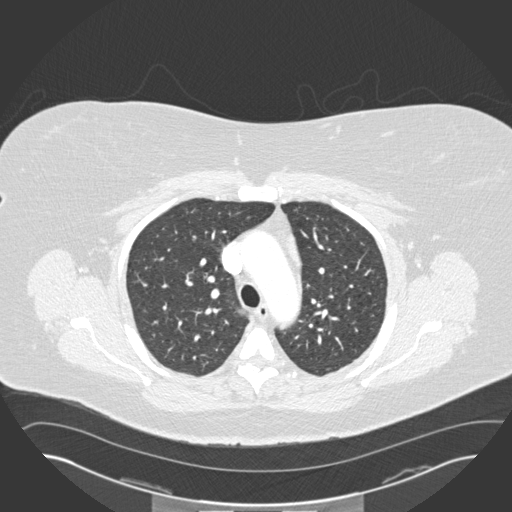
[im 227/292  mediastinal]
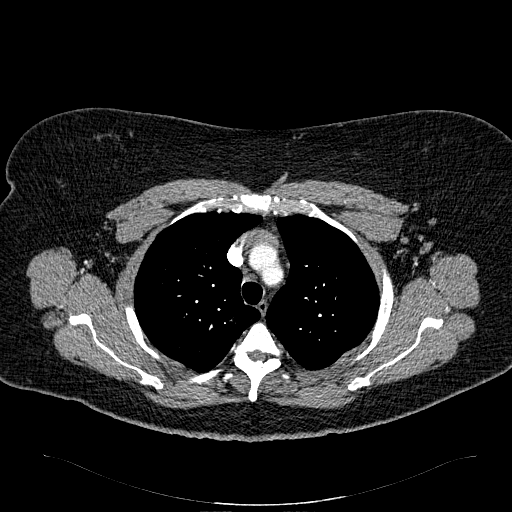
[im 243/292  lung]
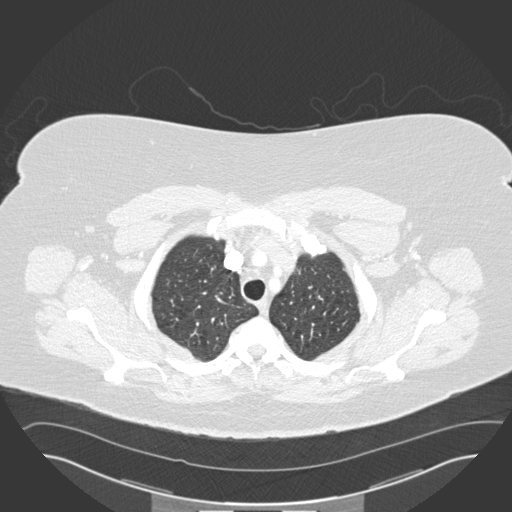
[im 259/292  mediastinal]
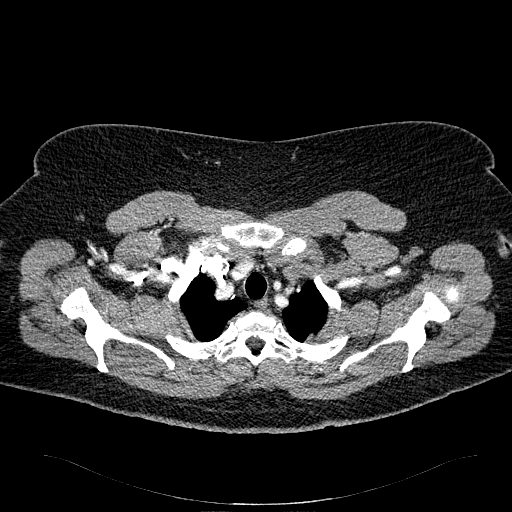
[im 275/292  lung]
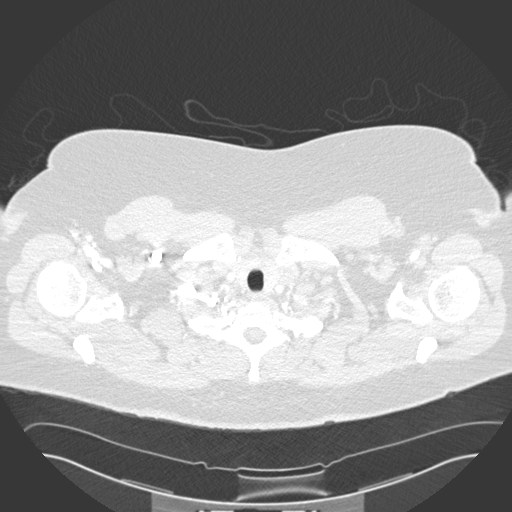

[Series 7: cor pe 2.0 mpr · coronal · 0.58mm/px · 1 of 140 slices shown]
[im 70/140  mediastinal]
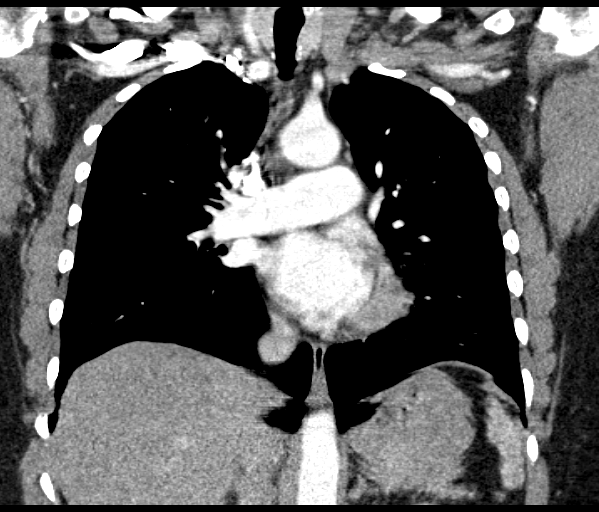

[18 of 36 positions shown; findings below may reference images not displayed]

FINDINGS: Cardiovascular: There is good opacification of the pulmonary
arteries. There is no pulmonary embolism. The thoracic aorta is
normal in caliber and intact.

Lungs: Clear

Central airways: Patent

Effusions: None

Lymphadenopathy: None

Esophagus: Unremarkable

Upper abdomen: No significant abnormality

Musculoskeletal: No significant abnormality. Moderate thoracic
degenerative disc changes.

Review of the MIP images confirms the above findings.
IMPRESSION: No significant abnormality

## 2016-09-13 ENCOUNTER — Other Ambulatory Visit: Payer: Self-pay | Admitting: Family Medicine

## 2016-09-13 DIAGNOSIS — Z1231 Encounter for screening mammogram for malignant neoplasm of breast: Secondary | ICD-10-CM

## 2017-05-31 ENCOUNTER — Other Ambulatory Visit: Payer: Self-pay

## 2017-05-31 ENCOUNTER — Encounter: Payer: Self-pay | Admitting: Emergency Medicine

## 2017-05-31 DIAGNOSIS — E860 Dehydration: Secondary | ICD-10-CM | POA: Insufficient documentation

## 2017-05-31 DIAGNOSIS — R42 Dizziness and giddiness: Secondary | ICD-10-CM | POA: Diagnosis present

## 2017-05-31 DIAGNOSIS — Z7984 Long term (current) use of oral hypoglycemic drugs: Secondary | ICD-10-CM | POA: Diagnosis not present

## 2017-05-31 DIAGNOSIS — J449 Chronic obstructive pulmonary disease, unspecified: Secondary | ICD-10-CM | POA: Diagnosis not present

## 2017-05-31 DIAGNOSIS — I10 Essential (primary) hypertension: Secondary | ICD-10-CM | POA: Diagnosis not present

## 2017-05-31 DIAGNOSIS — Z79899 Other long term (current) drug therapy: Secondary | ICD-10-CM | POA: Insufficient documentation

## 2017-05-31 DIAGNOSIS — R110 Nausea: Secondary | ICD-10-CM | POA: Insufficient documentation

## 2017-05-31 DIAGNOSIS — R197 Diarrhea, unspecified: Secondary | ICD-10-CM | POA: Insufficient documentation

## 2017-05-31 DIAGNOSIS — E119 Type 2 diabetes mellitus without complications: Secondary | ICD-10-CM | POA: Insufficient documentation

## 2017-05-31 DIAGNOSIS — F17210 Nicotine dependence, cigarettes, uncomplicated: Secondary | ICD-10-CM | POA: Insufficient documentation

## 2017-05-31 NOTE — ED Triage Notes (Signed)
Pt in with co dizziness for few days and feeling clammy. Pt co weakness all over, pt has had diarrhea for 2 weeks.

## 2017-06-01 ENCOUNTER — Emergency Department
Admission: EM | Admit: 2017-06-01 | Discharge: 2017-06-01 | Disposition: A | Discharge status: Home / Self Care | Payer: Medicare Other | Attending: Emergency Medicine | Admitting: Emergency Medicine

## 2017-06-01 DIAGNOSIS — R197 Diarrhea, unspecified: Secondary | ICD-10-CM

## 2017-06-01 DIAGNOSIS — R110 Nausea: Secondary | ICD-10-CM

## 2017-06-01 DIAGNOSIS — E860 Dehydration: Secondary | ICD-10-CM

## 2017-06-01 LAB — COMPREHENSIVE METABOLIC PANEL
ALK PHOS: 70 U/L (ref 38–126)
ALT: 18 U/L (ref 14–54)
AST: 21 U/L (ref 15–41)
Albumin: 4 g/dL (ref 3.5–5.0)
Anion gap: 9 (ref 5–15)
BILIRUBIN TOTAL: 0.4 mg/dL (ref 0.3–1.2)
BUN: 19 mg/dL (ref 6–20)
CALCIUM: 9.2 mg/dL (ref 8.9–10.3)
CO2: 27 mmol/L (ref 22–32)
Chloride: 101 mmol/L (ref 101–111)
Creatinine, Ser: 1.34 mg/dL — ABNORMAL HIGH (ref 0.44–1.00)
GFR calc non Af Amer: 41 mL/min — ABNORMAL LOW (ref >60)
GFR, EST AFRICAN AMERICAN: 48 mL/min — AB (ref >60)
GLUCOSE: 134 mg/dL — AB (ref 65–99)
Potassium: 3.4 mmol/L — ABNORMAL LOW (ref 3.5–5.1)
Sodium: 137 mmol/L (ref 135–145)
TOTAL PROTEIN: 7.5 g/dL (ref 6.5–8.1)

## 2017-06-01 LAB — CBC
HEMATOCRIT: 40.6 % (ref 35.0–47.0)
HEMOGLOBIN: 13.6 g/dL (ref 12.0–16.0)
MCH: 30.7 pg (ref 26.0–34.0)
MCHC: 33.5 g/dL (ref 32.0–36.0)
MCV: 91.7 fL (ref 80.0–100.0)
Platelets: 376 10*3/uL (ref 150–440)
RBC: 4.43 MIL/uL (ref 3.80–5.20)
RDW: 13.7 % (ref 11.5–14.5)
WBC: 8.1 10*3/uL (ref 3.6–11.0)

## 2017-06-01 LAB — TROPONIN I: Troponin I: <0.03 ng/mL (ref <0.03)

## 2017-06-01 MED ORDER — ONDANSETRON 4 MG PO TBDP
4.0000 mg | ORAL_TABLET | Freq: Once | ORAL | Status: AC
  Administered 2017-06-01: 4 mg via ORAL
  Filled 2017-06-01: qty 1

## 2017-06-01 MED ORDER — LOPERAMIDE HCL 2 MG PO CAPS
2.0000 mg | ORAL_CAPSULE | Freq: Once | ORAL | Status: AC
  Administered 2017-06-01: 2 mg via ORAL
  Filled 2017-06-01: qty 1

## 2017-06-01 MED ORDER — ONDANSETRON 4 MG PO TBDP: 4.0000 mg | ORAL_TABLET | Freq: Three times a day (TID) | ORAL | 0 refills | Status: DC | PRN

## 2017-06-01 NOTE — ED Notes (Signed)
Pt denies pain. Pt sts, "I just wanted my blood pressure checked but they never said I could leave."

## 2017-06-01 NOTE — Discharge Instructions (Signed)
It was a pleasure to take care of you today, and thank you for coming to our emergency department.  If you have any questions or concerns before leaving please ask the nurse to grab me and I'm more than happy to go through your aftercare instructions again.  If you were prescribed any opioid pain medication today such as Norco, Vicodin, Percocet, morphine, hydrocodone, or oxycodone please make sure you do not drive when you are taking this medication as it can alter your ability to drive safely.  If you have any concerns once you are home that you are not improving or are in fact getting worse before you can make it to your follow-up appointment, please do not hesitate to call 911 and come back for further evaluation.  Merrily Brittle, MD  Results for orders placed or performed during the hospital encounter of 06/01/17  CBC  Result Value Ref Range   WBC 8.1 3.6 - 11.0 K/uL   RBC 4.43 3.80 - 5.20 MIL/uL   Hemoglobin 13.6 12.0 - 16.0 g/dL   HCT 16.1 09.6 - 04.5 %   MCV 91.7 80.0 - 100.0 fL   MCH 30.7 26.0 - 34.0 pg   MCHC 33.5 32.0 - 36.0 g/dL   RDW 40.9 81.1 - 91.4 %   Platelets 376 150 - 440 K/uL  Comprehensive metabolic panel  Result Value Ref Range   Sodium 137 135 - 145 mmol/L   Potassium 3.4 (L) 3.5 - 5.1 mmol/L   Chloride 101 101 - 111 mmol/L   CO2 27 22 - 32 mmol/L   Glucose, Bld 134 (H) 65 - 99 mg/dL   BUN 19 6 - 20 mg/dL   Creatinine, Ser 7.82 (H) 0.44 - 1.00 mg/dL   Calcium 9.2 8.9 - 95.6 mg/dL   Total Protein 7.5 6.5 - 8.1 g/dL   Albumin 4.0 3.5 - 5.0 g/dL   AST 21 15 - 41 U/L   ALT 18 14 - 54 U/L   Alkaline Phosphatase 70 38 - 126 U/L   Total Bilirubin 0.4 0.3 - 1.2 mg/dL   GFR calc non Af Amer 41 (L) >60 mL/min   GFR calc Af Amer 48 (L) >60 mL/min   Anion gap 9 5 - 15  Troponin I  Result Value Ref Range   Troponin I <0.03 <0.03 ng/mL

## 2017-06-01 NOTE — ED Provider Notes (Signed)
[facility] Emergency Department Provider Note  ____________________________________________   First MD Initiated Contact with Patient 06/01/17 0330     (approximate)  I have reviewed the triage vital signs and the nursing notes.   HISTORY  Chief Complaint Dizziness   HPI Becky Perry is a 62 y.o. female itself presents the emergency department with lightheadedness, generalized weakness, and loose stools for the past 2 weeks or so.  She has not passed out.  Her symptoms are worse with standing up.  No chest pain or shortness of breath.  No palpitations.  She does feel generally "weak".  No fevers or chills.  Her symptoms are mild to moderate severity.  She is concerned that her blood pressure might be low.  She has had no recent antibiotics.  Past Medical History:  Diagnosis Date  . Anxiety   . COPD (chronic obstructive pulmonary disease) (HCC)   . Diabetes mellitus type II, controlled (HCC)   . Essential hypertension   . Morbid obesity (HCC)   . Tobacco abuse    a. 11/2013 smoking 2ppd    There are no active problems to display for this patient.   Past Surgical History:  Procedure Laterality Date  . CHOLECYSTECTOMY    . VAGINAL HYSTERECTOMY      Prior to Admission medications   Medication Sig Start Date End Date Taking? Authorizing Provider  albuterol (PROVENTIL HFA;VENTOLIN HFA) 108 (90 BASE) MCG/ACT inhaler Inhale 4-6 puffs by mouth every 4 hours as needed for wheezing, cough, and/or shortness of breath 12/27/14   Loleta Rose, MD  Fluticasone-Salmeterol (ADVAIR) 250-50 MCG/DOSE AEPB Inhale 1 puff into the lungs 2 (two) times daily.    [provider]  hydrochlorothiazide (HYDRODIURIL) 25 MG tablet Take 25 mg by mouth daily.    [provider]  ipratropium (ATROVENT HFA) 17 MCG/ACT inhaler Inhale 2 puffs into the lungs every 6 (six) hours as needed for wheezing.    [provider]  ipratropium-albuterol (DUONEB)  0.5-2.5 (3) MG/3ML SOLN Take 3 mLs by nebulization.    [provider]  lisinopril (PRINIVIL,ZESTRIL) 10 MG tablet Take 10 mg by mouth daily.    [provider]  metFORMIN (GLUCOPHAGE) 500 MG tablet Take by mouth daily with breakfast.    [provider]  ondansetron (ZOFRAN ODT) 4 MG disintegrating tablet Take 1 tablet (4 mg total) by mouth every 8 (eight) hours as needed for nausea or vomiting. 06/01/17   Merrily Brittle, MD  predniSONE (DELTASONE) 20 MG tablet Take 3 tablets (60 mg total) by mouth daily. 12/27/14   Loleta Rose, MD    Allergies Codeine sulfate and Erythromycin  Family History  Problem Relation Age of Onset  . COPD Mother        alive in her 13's  . Cancer Father        died in his 43's - heroine addict.    Social History Social History   Tobacco Use  . Smoking status: Current Every Day Smoker    Packs/day: 2.00    Years: 30.00    Pack years: 60.00    Types: Cigarettes  Substance Use Topics  . Alcohol use: No    Alcohol/week: 0.0 oz  . 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    Review of Systems Constitutional: No fever/chills Eyes: No visual changes. ENT: No sore throat. Cardiovascular: Denies chest pain. Respiratory: Denies shortness of breath. Gastrointestinal: No  abdominal pain.  Positive for nausea, no vomiting.  Positive for diarrhea.  No constipation. Genitourinary: Negative for dysuria. Musculoskeletal: Negative for back pain. Skin: Negative for rash. Neurological: Negative for headaches, focal weakness or numbness.   ____________________________________________   PHYSICAL EXAM:  VITAL SIGNS: ED Triage Vitals  Enc Vitals Group     BP 05/31/17 2332 105/70     Pulse Rate 05/31/17 2331 94     Resp 05/31/17 2331 18     Temp 05/31/17 2331 98.6 F (37 C)     Temp Source 05/31/17 2331 Oral     SpO2 05/31/17 2331 95 %     Weight 05/31/17 2331 292 lb (132.5 kg)      Height 05/31/17 2331  (1.727 m)     Head Circumference --      Peak Flow --      Pain Score 05/31/17 2331 8     Pain Loc --      Pain Edu? --      Excl. in GC? --     Constitutional: Alert and oriented x4 pleasant cooperative speaks full clear sentences no diaphoresis Eyes: PERRL EOMI. Head: Atraumatic. Nose: No congestion/rhinnorhea. Mouth/Throat: No trismus Neck: No stridor.   Cardiovascular: Normal rate, regular rhythm. Grossly normal heart sounds.  Good peripheral circulation. Respiratory: Normal respiratory effort.  No retractions. Lungs CTAB and moving good air Gastrointestinal: Soft mild diffuse tenderness no focality no rebound or guarding no peritonitis Musculoskeletal: No lower extremity edema   Neurologic:  Normal speech and language. No gross focal neurologic deficits are appreciated. Skin:  Skin is warm, dry and intact. No rash noted. Psychiatric: Mood and affect are normal. Speech and behavior are normal.    ____________________________________________   DIFFERENTIAL includes but not limited to  Dehydration, infectious diarrhea, functional diarrhea, metabolic derangement ____________________________________________   LABS (all labs ordered are listed, but only abnormal results are displayed)  Labs Reviewed  COMPREHENSIVE METABOLIC PANEL - Abnormal; Notable for the following components:      Result Value   Potassium 3.4 (*)    Glucose, Bld 134 (*)    Creatinine, Ser 1.34 (*)    GFR calc non Af Amer 41 (*)    GFR calc Af Amer 48 (*)    All other components within normal limits  CBC  TROPONIN I    Lab work reviewed by me shows slight acute kidney injury __________________________________________  EKG  ED ECG REPORT I, Merrily Brittle, the attending physician, personally viewed and interpreted this ECG.  Date: 06/01/2017 EKG Time:  Rate: 99 Rhythm: normal sinus rhythm QRS Axis: normal Intervals: normal ST/T Wave abnormalities:  normal Narrative Interpretation: no evidence of acute ischemia  ____________________________________________  RADIOLOGY   ____________________________________________   PROCEDURES  Procedure(s) performed: no  Procedures  Critical Care performed: no  Observation: no ____________________________________________   INITIAL IMPRESSION / ASSESSMENT AND PLAN / ED COURSE  Pertinent labs & imaging results that were available during my care of the patient were reviewed by me and considered in my medical decision making (see chart for details).  The patient clinically is slightly dehydrated along with loose stools for the past several weeks.  Given loperamide and Zofran with improvement in her symptoms.  She is able to orally hydrate.  Strict return precautions have been given and the patient verbalizes understanding agree with plan.      ____________________________________________   FINAL CLINICAL IMPRESSION(S) / ED DIAGNOSES  Final diagnoses:  Dehydration  Diarrhea, unspecified type  Nausea      NEW MEDICATIONS STARTED DURING THIS VISIT:  Discharge Medication List as of 06/01/2017  4:05 AM    START taking these medications   Details  ondansetron (ZOFRAN ODT) 4 MG disintegrating tablet Take 1 tablet (4 mg total) by mouth every 8 (eight) hours as needed for nausea or vomiting., Starting Fri 06/01/2017, Print         Note:  This document was prepared using Dragon voice recognition software and may include unintentional dictation errors.     Merrily Brittle, MD 06/01/17 [phone]

## 2019-10-13 LAB — LIPID PANEL
Cholesterol: 135 (ref 0–200)
HDL: 64 (ref 35–70)
LDL Cholesterol: 52
LDl/HDL Ratio: 0.8
Triglycerides: 104 (ref 40–160)

## 2019-10-13 LAB — HEPATIC FUNCTION PANEL
ALT: 16 (ref 7–35)
AST: 19 (ref 13–35)
Alkaline Phosphatase: 83 (ref 25–125)
Bilirubin, Total: 0.5

## 2019-10-13 LAB — CBC AND DIFFERENTIAL
HCT: 36 (ref 36–46)
Hemoglobin: 12 (ref 12.0–16.0)
Platelets: 298 (ref 150–399)
WBC: 6.6

## 2019-10-13 LAB — BASIC METABOLIC PANEL
BUN: 21 (ref 4–21)
Chloride: 104 (ref 99–108)
Creatinine: 1.1 (ref 0.5–1.1)
Glucose: 98
Potassium: 4.9 (ref 3.4–5.3)
Sodium: 144 (ref 137–147)

## 2019-10-13 LAB — COMPREHENSIVE METABOLIC PANEL
Albumin: 4.3 (ref 3.5–5.0)
Calcium: 9.6 (ref 8.7–10.7)
GFR calc Af Amer: 59
GFR calc non Af Amer: 51

## 2019-10-13 LAB — HEMOGLOBIN A1C: Hemoglobin A1C: 6.8

## 2019-10-13 LAB — CBC: RBC: 4.03 (ref 3.87–5.11)

## 2020-03-29 ENCOUNTER — Ambulatory Visit: Payer: Medicare Other | Admitting: Physician Assistant

## 2020-05-26 ENCOUNTER — Ambulatory Visit: Payer: Medicare Other | Admitting: Physician Assistant

## 2020-05-26 ENCOUNTER — Ambulatory Visit: Payer: Self-pay

## 2020-05-26 ENCOUNTER — Encounter: Payer: Self-pay | Admitting: Physician Assistant

## 2020-05-26 VITALS — Ht 66.0 in | Wt 354.0 lb

## 2020-05-26 DIAGNOSIS — M25552 Pain in left hip: Secondary | ICD-10-CM

## 2020-05-26 DIAGNOSIS — M25551 Pain in right hip: Secondary | ICD-10-CM | POA: Diagnosis not present

## 2020-05-26 DIAGNOSIS — M1611 Unilateral primary osteoarthritis, right hip: Secondary | ICD-10-CM

## 2020-05-26 DIAGNOSIS — M1612 Unilateral primary osteoarthritis, left hip: Secondary | ICD-10-CM

## 2020-05-26 DIAGNOSIS — Z6841 Body Mass Index (BMI) 40.0 and over, adult: Secondary | ICD-10-CM
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
## 2020-05-27 NOTE — Addendum Note (Signed)
Addended by: Barbette Or on: 05/27/2020 09:04 AM   Modules accepted: Orders

## 2020-05-31 ENCOUNTER — Telehealth: Payer: Self-pay

## 2020-05-31 NOTE — Telephone Encounter (Signed)
Patient called she is requesting information regarding a weight loss clinic gil referred her to call back:[phone]

## 2020-06-01 NOTE — Telephone Encounter (Signed)
Have we found someone to see her in [place]?

## 2020-06-01 NOTE — Telephone Encounter (Signed)
Order has been faxed to [facility] in [place]. Tried caling back no answer

## 2020-06-18 ENCOUNTER — Telehealth: Payer: Self-pay

## 2020-06-18 NOTE — Telephone Encounter (Signed)
Pt called and made it VERY clear she does not want a phone call from a bariatric surgeon she wanted the referral to go to the weight loss clinic to lose the weight naturally.  Please advise.

## 2020-06-21 NOTE — Telephone Encounter (Signed)
Patient aware that's not what we sent her for. She is going to [facility] in [place]

## 2020-07-26 ENCOUNTER — Encounter (INDEPENDENT_AMBULATORY_CARE_PROVIDER_SITE_OTHER): Payer: Self-pay

## 2020-07-27 ENCOUNTER — Ambulatory Visit (INDEPENDENT_AMBULATORY_CARE_PROVIDER_SITE_OTHER): Payer: Medicare Other | Admitting: Family Medicine

## 2020-07-27 ENCOUNTER — Encounter (INDEPENDENT_AMBULATORY_CARE_PROVIDER_SITE_OTHER): Payer: Self-pay | Admitting: Family Medicine

## 2020-07-27 ENCOUNTER — Other Ambulatory Visit: Payer: Self-pay

## 2020-07-27 VITALS — BP 100/68 | HR 96 | Temp 98.1°F | Ht 66.0 in | Wt 323.0 lb

## 2020-07-27 DIAGNOSIS — R0602 Shortness of breath: Secondary | ICD-10-CM | POA: Diagnosis not present

## 2020-07-27 DIAGNOSIS — E1159 Type 2 diabetes mellitus with other circulatory complications: Secondary | ICD-10-CM | POA: Diagnosis not present

## 2020-07-27 DIAGNOSIS — R5383 Other fatigue: Secondary | ICD-10-CM

## 2020-07-27 DIAGNOSIS — E559 Vitamin D deficiency, unspecified: Secondary | ICD-10-CM

## 2020-07-27 DIAGNOSIS — F3289 Other specified depressive episodes: Secondary | ICD-10-CM | POA: Diagnosis not present

## 2020-07-27 DIAGNOSIS — E785 Hyperlipidemia, unspecified: Secondary | ICD-10-CM

## 2020-07-27 DIAGNOSIS — Z6841 Body Mass Index (BMI) 40.0 and over, adult: Secondary | ICD-10-CM

## 2020-07-27 DIAGNOSIS — E119 Type 2 diabetes mellitus without complications: Secondary | ICD-10-CM | POA: Diagnosis not present

## 2020-07-27 DIAGNOSIS — Z0289 Encounter for other administrative examinations: Secondary | ICD-10-CM

## 2020-07-27 DIAGNOSIS — J449 Chronic obstructive pulmonary disease, unspecified: Secondary | ICD-10-CM

## 2020-07-27 DIAGNOSIS — I152 Hypertension secondary to endocrine disorders: Secondary | ICD-10-CM

## 2020-07-27 DIAGNOSIS — E1169 Type 2 diabetes mellitus with other specified complication: Secondary | ICD-10-CM

## 2020-07-28 LAB — COMPREHENSIVE METABOLIC PANEL
ALT: 27 IU/L (ref 0–32)
AST: 21 IU/L (ref 0–40)
Albumin/Globulin Ratio: 1.6 (ref 1.2–2.2)
Albumin: 4.6 g/dL (ref 3.8–4.8)
Alkaline Phosphatase: 85 IU/L (ref 44–121)
BUN/Creatinine Ratio: 16 (ref 12–28)
BUN: 22 mg/dL (ref 8–27)
Bilirubin Total: 0.4 mg/dL (ref 0.0–1.2)
CO2: 25 mmol/L (ref 20–29)
Calcium: 10.1 mg/dL (ref 8.7–10.3)
Chloride: 102 mmol/L (ref 96–106)
Creatinine, Ser: 1.34 mg/dL — ABNORMAL HIGH (ref 0.57–1.00)
Globulin, Total: 2.9 g/dL (ref 1.5–4.5)
Glucose: 94 mg/dL (ref 65–99)
Potassium: 4.5 mmol/L (ref 3.5–5.2)
Sodium: 139 mmol/L (ref 134–144)
Total Protein: 7.5 g/dL (ref 6.0–8.5)
eGFR: 44 mL/min/{1.73_m2} — ABNORMAL LOW (ref >59)

## 2020-07-28 LAB — CBC WITH DIFFERENTIAL/PLATELET
Basophils Absolute: 0 10*3/uL (ref 0.0–0.2)
Basos: 0 %
EOS (ABSOLUTE): 0.2 10*3/uL (ref 0.0–0.4)
Eos: 2 %
Hematocrit: 44.6 % (ref 34.0–46.6)
Hemoglobin: 14.7 g/dL (ref 11.1–15.9)
Immature Grans (Abs): 0 10*3/uL (ref 0.0–0.1)
Immature Granulocytes: 0 %
Lymphocytes Absolute: 3.3 10*3/uL — ABNORMAL HIGH (ref 0.7–3.1)
Lymphs: 37 %
MCH: 30.6 pg (ref 26.6–33.0)
MCHC: 33 g/dL (ref 31.5–35.7)
MCV: 93 fL (ref 79–97)
Monocytes Absolute: 0.6 10*3/uL (ref 0.1–0.9)
Monocytes: 7 %
Neutrophils Absolute: 4.9 10*3/uL (ref 1.4–7.0)
Neutrophils: 54 %
Platelets: 317 10*3/uL (ref 150–450)
RBC: 4.8 x10E6/uL (ref 3.77–5.28)
RDW: 11.6 % — ABNORMAL LOW (ref 11.7–15.4)
WBC: 9.1 10*3/uL (ref 3.4–10.8)

## 2020-07-28 LAB — VITAMIN D 25 HYDROXY (VIT D DEFICIENCY, FRACTURES): Vit D, 25-Hydroxy: 38.4 ng/mL (ref 30.0–100.0)

## 2020-07-28 LAB — LIPID PANEL WITH LDL/HDL RATIO
Cholesterol, Total: 107 mg/dL (ref 100–199)
HDL: 58 mg/dL (ref >39)
LDL Chol Calc (NIH): 30 mg/dL (ref 0–99)
LDL/HDL Ratio: 0.5 ratio (ref 0.0–3.2)
Triglycerides: 103 mg/dL (ref 0–149)
VLDL Cholesterol Cal: 19 mg/dL (ref 5–40)

## 2020-07-28 LAB — TSH: TSH: 4.3 u[IU]/mL (ref 0.450–4.500)

## 2020-07-28 LAB — HEMOGLOBIN A1C
Est. average glucose Bld gHb Est-mCnc: 88 mg/dL
Hgb A1c MFr Bld: 4.7 % — ABNORMAL LOW (ref 4.8–5.6)

## 2020-07-28 LAB — FOLATE: Folate: >20 ng/mL (ref >3.0)

## 2020-07-28 LAB — T3: T3, Total: 128 ng/dL (ref 71–180)

## 2020-07-28 LAB — T4, FREE: Free T4: 1.23 ng/dL (ref 0.82–1.77)

## 2020-07-28 LAB — VITAMIN B12: Vitamin B-12: 1288 pg/mL — ABNORMAL HIGH (ref 232–1245)

## 2020-07-28 LAB — INSULIN, RANDOM: INSULIN: 32 u[IU]/mL — ABNORMAL HIGH (ref 2.6–24.9)
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
## 2020-08-10 ENCOUNTER — Other Ambulatory Visit: Payer: Self-pay

## 2020-08-10 ENCOUNTER — Ambulatory Visit (INDEPENDENT_AMBULATORY_CARE_PROVIDER_SITE_OTHER): Payer: Medicare Other | Admitting: Family Medicine

## 2020-08-10 VITALS — BP 115/75 | HR 99 | Temp 98.5°F | Ht 66.0 in | Wt 321.0 lb

## 2020-08-10 DIAGNOSIS — N1831 Chronic kidney disease, stage 3a: Secondary | ICD-10-CM | POA: Diagnosis not present

## 2020-08-10 DIAGNOSIS — Z6841 Body Mass Index (BMI) 40.0 and over, adult: Secondary | ICD-10-CM

## 2020-08-10 DIAGNOSIS — E785 Hyperlipidemia, unspecified: Secondary | ICD-10-CM

## 2020-08-10 DIAGNOSIS — I152 Hypertension secondary to endocrine disorders: Secondary | ICD-10-CM

## 2020-08-10 DIAGNOSIS — E1159 Type 2 diabetes mellitus with other circulatory complications: Secondary | ICD-10-CM | POA: Diagnosis not present

## 2020-08-10 DIAGNOSIS — E559 Vitamin D deficiency, unspecified: Secondary | ICD-10-CM

## 2020-08-10 DIAGNOSIS — E1169 Type 2 diabetes mellitus with other specified complication: Secondary | ICD-10-CM

## 2020-08-10 DIAGNOSIS — E119 Type 2 diabetes mellitus without complications: Secondary | ICD-10-CM | POA: Insufficient documentation

## 2020-08-10 MED ORDER — OZEMPIC (0.25 OR 0.5 MG/DOSE) 2 MG/1.5ML ~~LOC~~ SOPN: PEN_INJECTOR | SUBCUTANEOUS | Status: DC

## 2020-08-10 MED ORDER — METFORMIN HCL 500 MG PO TABS: ORAL_TABLET | ORAL | Status: AC

## 2020-08-11 ENCOUNTER — Telehealth (INDEPENDENT_AMBULATORY_CARE_PROVIDER_SITE_OTHER): Payer: Self-pay

## 2020-08-11 ENCOUNTER — Encounter (INDEPENDENT_AMBULATORY_CARE_PROVIDER_SITE_OTHER): Payer: Self-pay

## 2020-08-11 MED ORDER — VITAMIN D (ERGOCALCIFEROL) 1.25 MG (50000 UNIT) PO CAPS: 50000.0000 [IU] | ORAL_CAPSULE | ORAL | 0 refills | Status: DC

## 2020-08-11 NOTE — Telephone Encounter (Signed)
Called patient and left a detailed message. Johniya Durfee, CMA

## 2020-08-11 NOTE — Telephone Encounter (Signed)
Patient returned April's call.  Her pharmacy is the [facility] in [place] which also has a pharmacy that she uses.  Thank you

## 2020-08-11 NOTE — Telephone Encounter (Signed)
Pt last seen by Dr. Opalski.  

## 2020-08-11 NOTE — Telephone Encounter (Signed)
Patient says she is returning a call. °

## 2020-08-11 NOTE — Telephone Encounter (Signed)
Prescription was sent to the patient pharmacy. Bernon Arviso, CMA

## 2020-08-16 ENCOUNTER — Telehealth (INDEPENDENT_AMBULATORY_CARE_PROVIDER_SITE_OTHER): Payer: Medicare Other | Admitting: Psychology

## 2020-08-16 ENCOUNTER — Encounter (INDEPENDENT_AMBULATORY_CARE_PROVIDER_SITE_OTHER): Payer: Self-pay

## 2020-08-17 ENCOUNTER — Telehealth (INDEPENDENT_AMBULATORY_CARE_PROVIDER_SITE_OTHER): Payer: Medicare Other | Admitting: Psychology
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
## 2020-08-23 NOTE — Progress Notes (Signed)
Chief Complaint:   OBESITY Becky Perry is here to discuss her progress with her obesity treatment plan along with follow-up of her obesity related diagnoses.   Today's visit was #: 2 Starting weight: 323 lbs Starting date: 07/27/2020 Today's weight: 321 lbs Today's date: 08/10/2020 Weight change since last visit: 2 lbs Total lbs lost to date: 2 lbs Body mass index is 51.81 kg/m.  Total weight loss percentage to date: -0.62%  Interim History:  JANESIA Perry is here today for her first follow-up office visit since starting the program with [place].  All blood work/ lab tests that were recently ordered by myself or an outside provider were reviewed with patient today per their request.   Extended time was spent counseling her on all new disease processes that were discovered or preexisting ones that are worsening.  she understands that many of these abnormalities will need to monitored regularly along with the current treatment plan of prudent dietary changes, in which we are making each and every office visit, to improve these health parameters.  [name] says she had cake and bacon, grits with cheese, and butter.  I cheated a lot on salad dressings.  Per patient, she was not ready to follow the plan yet and did not follow it, but did grocery shopping today and plans to start on plan tomorrow.  Current Meal Plan: the Category 1 Plan for 0% of the time.  Current Exercise Plan: Pool for 60 minutes 2 times per week.  Assessment/Plan:   Medications Discontinued During This Encounter  Medication Reason   predniSONE (DELTASONE) 20 MG tablet Error   ondansetron (ZOFRAN ODT) 4 MG disintegrating tablet Error   metFORMIN (GLUCOPHAGE) 500 MG tablet    Semaglutide,0.25 or 0.5MG /DOS, (OZEMPIC, 0.25 OR 0.5 MG/DOSE,) 2 MG/1.5ML SOPN Reorder   Vitamin D, Ergocalciferol, (DRISDOL) 1.25 MG (50000 UNIT) CAPS capsule Reorder   Meds ordered this encounter  Medications   Semaglutide,0.25 or 0.5MG /DOS,  (OZEMPIC, 0.25 OR 0.5 MG/DOSE,) 2 MG/1.5ML SOPN    Sig: Dec dose to 0.25mg  wkly   metFORMIN (GLUCOPHAGE) 500 MG tablet    Sig: Dec dose to 250mg  BID   DISCONTD: Vitamin D, Ergocalciferol, (DRISDOL) 1.25 MG (50000 UNIT) CAPS capsule    Sig: Take 1 capsule (50,000 Units total) by mouth every 7 (seven) days.    Dispense:  4 capsule    Refill:  0   Vitamin D, Ergocalciferol, (DRISDOL) 1.25 MG (50000 UNIT) CAPS capsule    Sig: Take 1 capsule (50,000 Units total) by mouth every 7 (seven) days.    Dispense:  4 capsule    Refill:  0    1. Type 2 diabetes mellitus with other specified complication, without long-term current use of insulin (HCC) Diabetes Mellitus: At goal. Medication: metformin 500 mg daily and Ozempic 0.5 mg subcutaneously weekly. Issues reviewed: blood sugar goals, complications of diabetes mellitus, hypoglycemia prevention and treatment, exercise, and nutrition.  PCP started her on Ozempic a couple of months ago.  A1c is lower than normal range now.  She is without concerns or complaints.  FBS is mid 80s-115.  Denies lows or new symptoms.  Plan:  Discussed labs with patient today.  Discuss with PCP your low A1c to determine next best steps for management.  In the mean time, decrease metformin to 1/2 tablet twice daily and decrease Ozempic by half to 0.25 mg weekly until you speak with your PCP. The importance of regular follow up with PCP and all other  specialists as scheduled was stressed to patient today. The patient will continue to focus on protein-rich, low simple carbohydrate foods. We reviewed the importance of hydration, regular exercise for stress reduction, and restorative sleep.   Lab Results  Component Value Date   HGBA1C 4.7 (L) 07/27/2020   HGBA1C 6.8 10/13/2019   HGBA1C 10.2 (H) 01/01/2014   Lab Results  Component Value Date   LDLCALC 30 07/27/2020   CREATININE 1.34 (H) 07/27/2020   - Decrease and refill semaglutide,0.25 or 0.5MG /DOS, (OZEMPIC, 0.25 OR 0.5  MG/DOSE,) 2 MG/1.5ML SOPN; Dec dose to 0.25mg  wkly - Decrease and refill metFORMIN (GLUCOPHAGE) 500 MG tablet; Dec dose to 250mg  BID  2. Hypertension associated with diabetes (HCC) At goal. Medications: HCTZ 25 mg daily, lisinopril 10 mg daily.    Plan:  Discussed labs with patient today.  Avoid buying foods that are: processed, frozen, or prepackaged to avoid excess salt.  Blood pressure stable.  Counseling done.  Continue ACE and HCTZ.  Will monitor with weight loss journey. We will watch for signs of hypotension as she continues lifestyle modifications. We will continue to monitor closely alongside her PCP and/or Specialist.  Regular follow up with PCP and specialists was also encouraged.   BP Readings from Last 3 Encounters:  08/10/20 115/75  07/27/20 100/68  06/01/17 (!) 149/89   Lab Results  Component Value Date   CREATININE 1.34 (H) 07/27/2020   3. Hyperlipidemia associated with type 2 diabetes mellitus (HCC) Course: At goal. Lipid-lowering medications: Pravachol 40 mg daily.    Plan:  Discussed labs with patient today.  Continue current medications per PCP.   Dietary changes: Increase soluble fiber, decrease simple carbohydrates, decrease saturated fat. Exercise changes: Moderate to vigorous-intensity aerobic activity 150 minutes per week or as tolerated. We will continue to monitor along with PCP/specialists as it pertains to her weight loss journey.  Lab Results  Component Value Date   CHOL 107 07/27/2020   HDL 58 07/27/2020   LDLCALC 30 07/27/2020   TRIG 103 07/27/2020   Lab Results  Component Value Date   ALT 27 07/27/2020   AST 21 07/27/2020   ALKPHOS 85 07/27/2020   BILITOT 0.4 07/27/2020   4. Stage 3a chronic kidney disease (HCC) Assessment: CKD.  No change or worsening of her serum creatinine, but per patient, she has never been told her kidneys were not as good as they should be.  Plan:  New.  Discussed labs with patient today.  Extensive counseling done.   Control blood pressure and blood sugar.  Continue prudent nutritional plan and weight loss.  Continue to monitor BMP and avoid nephrotoxic agents. Diet: Avoid buying foods that are: processed, frozen, or prepackaged to avoid excess salt.   Lab Results  Component Value Date   CREATININE 1.34 (H) 07/27/2020   CREATININE 1.1 10/13/2019   CREATININE 1.34 (H) 05/31/2017   Lab Results  Component Value Date   CREATININE 1.34 (H) 07/27/2020   BUN 22 07/27/2020   NA 139 07/27/2020   K 4.5 07/27/2020   CL 102 07/27/2020   CO2 25 07/27/2020   5. Vitamin D deficiency Not at goal.  She is not taking an OTC vitamin D supplement.  Plan: - Discussed importance of vitamin D to their health and well-being.  - possible symptoms of low Vitamin D can be low energy, depressed mood, muscle aches, joint aches, osteoporosis etc. - low Vitamin D levels may be linked to an increased risk of cardiovascular events and even  increased risk of cancers- such as colon and breast.  - I recommend pt take weekly prescription vit D - see script below   - Informed patient this may be a lifelong thing, and she was encouraged to continue to take the medicine until told otherwise.   - we will need to monitor levels regularly (every 3-4 mo on average) to keep levels within normal limits.  - weight loss will likely improve availability of vitamin D, thus encouraged Joi to continue with meal plan and their weight loss efforts to further improve this condition - pt's questions and concerns regarding this condition addressed.  Lab Results  Component Value Date   VD25OH 38.4 07/27/2020   - Refill Vitamin D, Ergocalciferol, (DRISDOL) 1.25 MG (50000 UNIT) CAPS capsule; Take 1 capsule (50,000 Units total) by mouth every 7 (seven) days.  Dispense: 4 capsule; Refill: 0  6. Class 3 severe obesity with serious comorbidity and body mass index (BMI) of 50.0 to 59.9 in adult, unspecified obesity type (HCC)  Course: Shiane is  currently in the action stage of change. As such, her goal is to continue with weight loss efforts.   Nutrition goals: She has agreed to the Category 1 Plan.   Exercise goals:  As is.  Behavioral modification strategies: increasing lean protein intake, decreasing simple carbohydrates, meal planning and cooking strategies, keeping healthy foods in the home, and planning for success.  [place] has agreed to follow-up with our clinic in 2 weeks with APP only due to significant mobility issues. She was informed of the importance of frequent follow-up visits to maximize her success with intensive lifestyle modifications for her multiple health conditions.   Objective:   Blood pressure 115/75, pulse 99, temperature 98.5 F (36.9 C), height 5\' 6"  (1.676 m), weight (!) 321 lb (145.6 kg), SpO2 95 %. Body mass index is 51.81 kg/m.  General: Cooperative, alert, well developed, in no acute distress. HEENT: Conjunctivae and lids unremarkable. Cardiovascular: Regular rhythm.  Lungs: Normal work of breathing. Neurologic: No focal deficits.   Lab Results  Component Value Date   CREATININE 1.34 (H) 07/27/2020   BUN 22 07/27/2020   NA 139 07/27/2020   K 4.5 07/27/2020   CL 102 07/27/2020   CO2 25 07/27/2020   Lab Results  Component Value Date   ALT 27 07/27/2020   AST 21 07/27/2020   ALKPHOS 85 07/27/2020   BILITOT 0.4 07/27/2020   Lab Results  Component Value Date   HGBA1C 4.7 (L) 07/27/2020   HGBA1C 6.8 10/13/2019   HGBA1C 10.2 (H) 01/01/2014   Lab Results  Component Value Date   INSULIN 32.0 (H) 07/27/2020   Lab Results  Component Value Date   TSH 4.300 07/27/2020   Lab Results  Component Value Date   CHOL 107 07/27/2020   HDL 58 07/27/2020   LDLCALC 30 07/27/2020   TRIG 103 07/27/2020   Lab Results  Component Value Date   VD25OH 38.4 07/27/2020   Lab Results  Component Value Date   WBC 9.1 07/27/2020   HGB 14.7 07/27/2020   HCT 44.6 07/27/2020   MCV 93 07/27/2020    PLT 317 07/27/2020   Attestation Statements:   Reviewed by clinician on day of visit: allergies, medications, problem list, medical history, surgical history, family history, social history, and previous encounter notes.  Time spent on visit including pre-visit chart review and post-visit care and charting was 43 minutes.   I, 07/29/2020, CMA, am acting as Insurance claims handler for Energy manager  Charlett Merkle, DO.  I have reviewed the above documentation for accuracy and completeness, and I agree with the above. Marjory Sneddon, D.O.  The [place] was signed into law in 2016 which includes the topic of electronic health records.  This provides immediate access to information in MyChart.  This includes consultation notes, operative notes, office notes, lab results and pathology reports.  If you have any questions about what you read please let us know at your next visit so we can discuss your concerns and take corrective action if need be.  We are right here with you.

## 2020-08-24 ENCOUNTER — Ambulatory Visit (INDEPENDENT_AMBULATORY_CARE_PROVIDER_SITE_OTHER): Payer: Medicare Other | Admitting: Family Medicine

## 2020-08-25 ENCOUNTER — Ambulatory Visit: Payer: Medicare Other | Admitting: Physician Assistant

## 2020-08-25 ENCOUNTER — Ambulatory Visit: Payer: Medicare Other | Admitting: Orthopaedic Surgery

## 2020-08-25 ENCOUNTER — Ambulatory Visit (INDEPENDENT_AMBULATORY_CARE_PROVIDER_SITE_OTHER): Payer: Medicare Other | Admitting: Family Medicine

## 2020-08-30 ENCOUNTER — Telehealth (INDEPENDENT_AMBULATORY_CARE_PROVIDER_SITE_OTHER): Payer: Self-pay | Admitting: Psychology

## 2020-08-30 ENCOUNTER — Telehealth (INDEPENDENT_AMBULATORY_CARE_PROVIDER_SITE_OTHER): Payer: Medicare Other | Admitting: Psychology

## 2020-08-30 NOTE — Telephone Encounter (Signed)
  Office: [phone]  /  Fax: [phone]  Date of Call: August 30, 2020  Time of Call: 9:03am Provider: Lawerance Cruel, PsyD  CONTENT: This provider called Jasmine December to check-in as she did not present for today's MyChart Video Visit appointment at 9:00am. A HIPAA compliant voicemail was left requesting a call back. Of note, this provider stayed on the MyChart Video Visit appointment for 5 minutes prior to signing off per the clinic's grace period policy.    PLAN: This provider will wait for Nelani to call back. No further follow-up planned by this provider.

## 2020-09-07 ENCOUNTER — Other Ambulatory Visit (INDEPENDENT_AMBULATORY_CARE_PROVIDER_SITE_OTHER): Payer: Self-pay | Admitting: Family Medicine

## 2020-09-07 DIAGNOSIS — E559 Vitamin D deficiency, unspecified: Secondary | ICD-10-CM

## 2020-09-08 ENCOUNTER — Ambulatory Visit: Payer: Medicare Other | Admitting: Orthopaedic Surgery

## 2020-09-14 ENCOUNTER — Other Ambulatory Visit (INDEPENDENT_AMBULATORY_CARE_PROVIDER_SITE_OTHER): Payer: Self-pay | Admitting: Family Medicine

## 2020-09-14 DIAGNOSIS — E559 Vitamin D deficiency, unspecified: Secondary | ICD-10-CM
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
## 2020-09-15 NOTE — Telephone Encounter (Signed)
Last OV with Dr Opalski 

## 2020-09-28 ENCOUNTER — Telehealth (INDEPENDENT_AMBULATORY_CARE_PROVIDER_SITE_OTHER): Payer: Medicare Other | Admitting: Psychology

## 2020-09-28 DIAGNOSIS — F3289 Other specified depressive episodes: Secondary | ICD-10-CM

## 2020-09-28 NOTE — Progress Notes (Unsigned)
  Office: [phone]  /  Fax: [phone]    Date: October 12, 2020   Appointment Start Time: *** Duration: *** minutes Provider: Lawerance Cruel, Psy.D. Type of Session: Individual Therapy  Location of Patient: {gbptloc:23249} Location of Provider: Provider's Home (private office) Type of Contact: Telepsychological Visit via MyChart Video Visit  Session Content: Becky Perry is a 65 y.o. female presenting for a follow-up appointment to address the previously established treatment goal of increasing coping skills.Today's appointment was a telepsychological visit due to COVID-19. Becky Perry provided verbal consent for today's telepsychological appointment and she is aware she is responsible for securing confidentiality on her end of the session. Prior to proceeding with today's appointment, Becky Perry's physical location at the time of this appointment was obtained as well a phone number she could be reached at in the event of technical difficulties. Becky Perry and this provider participated in today's telepsychological service.   This provider conducted a brief check-in. *** Becky Perry was receptive to today's appointment as evidenced by openness to sharing, responsiveness to feedback, and {gbreceptiveness:23401}.  Mental Status Examination:  Appearance: {Appearance:22431} Behavior: {Behavior:22445} Mood: {gbmood:21757} Affect: {Affect:22436} Speech: {Speech:22432} Eye Contact: {Eye Contact:22433} Psychomotor Activity: {Motor Activity:22434} Gait: {gbgait:23404} Thought Process: {thought process:22448}  Thought Content/Perception: {disturbances:22451} Orientation: {Orientation:22437} Memory/Concentration: {gbcognition:22449} Insight/Judgment: {Insight:22446}  Interventions:  {Interventions for Progress Notes:23405}  DSM-5 Diagnosis(es): F32.89 Other Specified Depressive Disorder, Emotional Eating Behaviors  Treatment Goal & Progress: During the initial appointment with this provider, the following  treatment goal was established: increase coping skills. Becky Perry has demonstrated progress in her goal as evidenced by {gbtxprogress:22839}. Becky Perry also {gbtxprogress2:22951}.  Plan: The next appointment will be scheduled in {gbweeks:21758}, which will be {gbtxmodality:23402}. The next session will focus on {Plan for Next Appointment:23400}.

## 2020-10-06 ENCOUNTER — Ambulatory Visit: Payer: Medicare Other | Admitting: Physician Assistant

## 2020-10-12 ENCOUNTER — Telehealth (INDEPENDENT_AMBULATORY_CARE_PROVIDER_SITE_OTHER): Payer: Medicare Other | Admitting: Psychology

## 2020-10-25 ENCOUNTER — Ambulatory Visit (INDEPENDENT_AMBULATORY_CARE_PROVIDER_SITE_OTHER): Payer: Medicare Other | Admitting: Family Medicine

## 2020-10-26 ENCOUNTER — Ambulatory Visit (INDEPENDENT_AMBULATORY_CARE_PROVIDER_SITE_OTHER): Payer: Medicare Other | Admitting: Family Medicine

## 2020-10-28 ENCOUNTER — Ambulatory Visit (INDEPENDENT_AMBULATORY_CARE_PROVIDER_SITE_OTHER): Payer: Medicare Other | Admitting: Family Medicine

## 2020-10-28 ENCOUNTER — Encounter (INDEPENDENT_AMBULATORY_CARE_PROVIDER_SITE_OTHER): Payer: Self-pay | Admitting: Family Medicine

## 2020-10-28 ENCOUNTER — Other Ambulatory Visit: Payer: Self-pay

## 2020-10-28 VITALS — BP 141/81 | HR 92 | Temp 98.1°F | Ht 66.0 in | Wt 325.0 lb

## 2020-10-28 DIAGNOSIS — E1169 Type 2 diabetes mellitus with other specified complication: Secondary | ICD-10-CM

## 2020-10-28 DIAGNOSIS — F3289 Other specified depressive episodes: Secondary | ICD-10-CM

## 2020-10-28 DIAGNOSIS — Z6841 Body Mass Index (BMI) 40.0 and over, adult: Secondary | ICD-10-CM

## 2020-10-28 DIAGNOSIS — E559 Vitamin D deficiency, unspecified: Secondary | ICD-10-CM | POA: Diagnosis not present

## 2020-10-28 MED ORDER — VITAMIN D (ERGOCALCIFEROL) 1.25 MG (50000 UNIT) PO CAPS: 50000.0000 [IU] | ORAL_CAPSULE | ORAL | 0 refills | Status: DC

## 2020-10-28 MED ORDER — BUPROPION HCL ER (SR) 200 MG PO TB12: ORAL_TABLET | ORAL | 0 refills | Status: AC

## 2020-10-28 NOTE — Progress Notes (Signed)
Chief Complaint:   OBESITY Becky Perry is here to discuss her progress with her obesity treatment plan along with follow-up of her obesity related diagnoses. Becky Perry is on the Category 1 Plan and states she is following her eating plan approximately 25% of the time. Becky Perry states she is doing 0 minutes 0 times per week.  Today's visit was #: 3 Starting weight: 323 lbs Starting date: 07/27/2020 Today's weight: 325 lbs Today's date: 10/28/2020 Total lbs lost to date: 0 Total lbs lost since last in-office visit: 0  Interim History: Becky Perry has not done well with following her eating plan over the last 10 weeks since her last visit. She is struggling with emotional eating and cravings, and she is feeling deprived.  Subjective:   1. Type 2 diabetes mellitus with other specified complication, without long-term current use of insulin (HCC) Becky Perry is doing well on Ozempic and metformin, and she denies nausea or vomiting. She is struggling with following her eating plan.  2. Vitamin D deficiency Becky Perry is stable on Vit D, but her level is not yet at goal.  3. Other depression, with emotional eating behavior Becky Perry was on Wellbutrin XL 150 mg approximately 2 months ago. She is still struggling with emotional eating behaviors and cravings.  Assessment/Plan:   1. Type 2 diabetes mellitus with other specified complication, without long-term current use of insulin (HCC) Becky Perry agreed to continue Ozempic and metformin, no refill needed today. Good blood sugar control is important to decrease the likelihood of diabetic complications such as nephropathy, neuropathy, limb loss, blindness, coronary artery disease, and death. Intensive lifestyle modification including diet, exercise and weight loss are the first line of treatment for diabetes.   2. Vitamin D deficiency Low Vitamin D level contributes to fatigue and are associated with obesity, breast, and colon cancer. We will refill prescription Vitamin  D for 1 month. Becky Perry will follow-up for routine testing of Vitamin D, at least 2-3 times per year to avoid over-replacement.  - Vitamin D, Ergocalciferol, (DRISDOL) 1.25 MG (50000 UNIT) CAPS capsule; Take 1 capsule (50,000 Units total) by mouth every 7 (seven) days.  Dispense: 4 capsule; Refill: 0  3. Other depression, with emotional eating behavior Behavior modification techniques were discussed today to help Becky Perry deal with her emotional/non-hunger eating behaviors. Becky Perry agreed to discontinue Wellbutrin XL and start Wellbutrin SR 200 mg q AM with no refills. Orders and follow up as documented in patient record.   - buPROPion (WELLBUTRIN SR) 200 MG 12 hr tablet; Q AM  Dispense: 30 tablet; Refill: 0  4. Obesity with current BMI of 52.5 Becky Perry is currently in the action stage of change. As such, her goal is to continue with weight loss efforts. She has agreed to the Category 1 Plan.   Behavioral modification strategies: decreasing simple carbohydrates.  Becky Perry has agreed to follow-up with our clinic in 2 to 3 weeks. She was informed of the importance of frequent follow-up visits to maximize her success with intensive lifestyle modifications for her multiple health conditions.   Objective:   Blood pressure (!) 141/81, pulse 92, temperature 98.1 F (36.7 C), height 5\' 6"  (1.676 m), weight (!) 325 lb (147.4 kg), SpO2 95 %. Body mass index is 52.46 kg/m.  General: Cooperative, alert, well developed, in no acute distress. HEENT: Conjunctivae and lids unremarkable. Cardiovascular: Regular rhythm.  Lungs: Normal work of breathing. Neurologic: No focal deficits.   Lab Results  Component Value Date   CREATININE 1.34 (H) 07/27/2020  BUN 22 07/27/2020   NA 139 07/27/2020   K 4.5 07/27/2020   CL 102 07/27/2020   CO2 25 07/27/2020   Lab Results  Component Value Date   ALT 27 07/27/2020   AST 21 07/27/2020   ALKPHOS 85 07/27/2020   BILITOT 0.4 07/27/2020   Lab Results  Component  Value Date   HGBA1C 4.7 (L) 07/27/2020   HGBA1C 6.8 10/13/2019   HGBA1C 10.2 (H) 01/01/2014   Lab Results  Component Value Date   INSULIN 32.0 (H) 07/27/2020   Lab Results  Component Value Date   TSH 4.300 07/27/2020   Lab Results  Component Value Date   CHOL 107 07/27/2020   HDL 58 07/27/2020   LDLCALC 30 07/27/2020   TRIG 103 07/27/2020   Lab Results  Component Value Date   VD25OH 38.4 07/27/2020   Lab Results  Component Value Date   WBC 9.1 07/27/2020   HGB 14.7 07/27/2020   HCT 44.6 07/27/2020   MCV 93 07/27/2020   PLT 317 07/27/2020   No results found for: IRON, TIBC, FERRITIN  Attestation Statements:   Reviewed by clinician on day of visit: allergies, medications, problem list, medical history, surgical history, family history, social history, and previous encounter notes.  Time spent on visit including pre-visit chart review and post-visit care and charting was 42 minutes.    I, Jun Rightmyer, am acting as transcriptionist for Quillian Quince, MD.  I have reviewed the above documentation for accuracy and completeness, and I agree with the above. -  Quillian Quince, MD

## 2020-11-04 ENCOUNTER — Telehealth: Payer: Self-pay | Admitting: Physician Assistant

## 2020-11-04 NOTE — Telephone Encounter (Signed)
Pt would like to know if she can get a mobility scooter. She still has another 100 pounds before she can have her surgery. Call her please [phone]. She call her insurance company and she can get one as long as she has a Equ[employer].

## 2020-11-04 NOTE — Telephone Encounter (Signed)
Called and advised pt. She will call her pcp first to see if they can do it for her 1st before scheduling to come in.

## 2020-11-04 NOTE — Telephone Encounter (Signed)
Please advise 

## 2020-11-18 ENCOUNTER — Ambulatory Visit (INDEPENDENT_AMBULATORY_CARE_PROVIDER_SITE_OTHER): Payer: Medicare Other | Admitting: Adult Health

## 2021-03-15 ENCOUNTER — Ambulatory Visit (INDEPENDENT_AMBULATORY_CARE_PROVIDER_SITE_OTHER): Payer: Medicare Other | Admitting: Family Medicine

## 2021-03-29 ENCOUNTER — Other Ambulatory Visit: Payer: Self-pay

## 2021-03-29 ENCOUNTER — Encounter (INDEPENDENT_AMBULATORY_CARE_PROVIDER_SITE_OTHER): Payer: Self-pay | Admitting: Family Medicine

## 2021-03-29 ENCOUNTER — Ambulatory Visit (INDEPENDENT_AMBULATORY_CARE_PROVIDER_SITE_OTHER): Payer: Medicare Other | Admitting: Family Medicine

## 2021-03-29 VITALS — BP 102/61 | HR 74 | Temp 98.1°F | Ht 66.0 in | Wt 314.0 lb

## 2021-03-29 DIAGNOSIS — E669 Obesity, unspecified: Secondary | ICD-10-CM | POA: Diagnosis not present

## 2021-03-29 DIAGNOSIS — E559 Vitamin D deficiency, unspecified: Secondary | ICD-10-CM | POA: Diagnosis not present

## 2021-03-29 DIAGNOSIS — Z7985 Long-term (current) use of injectable non-insulin antidiabetic drugs: Secondary | ICD-10-CM

## 2021-03-29 DIAGNOSIS — N1831 Chronic kidney disease, stage 3a: Secondary | ICD-10-CM | POA: Diagnosis not present

## 2021-03-29 DIAGNOSIS — E1169 Type 2 diabetes mellitus with other specified complication: Secondary | ICD-10-CM | POA: Diagnosis not present

## 2021-03-29 DIAGNOSIS — Z6841 Body Mass Index (BMI) 40.0 and over, adult: Secondary | ICD-10-CM | POA: Diagnosis not present

## 2021-03-30 NOTE — Progress Notes (Signed)
Chief Complaint:   OBESITY Becky Perry is here to discuss her progress with her obesity treatment plan along with follow-up of her obesity related diagnoses. Becky Perry is on the Category 1 Plan and states she is following her eating plan approximately 0% of the time. Becky Perry states she is doing 0 minutes 0 times per week.  Today's visit was #: 4 Starting weight: 323 lbs Starting date: 07/27/2020 Today's weight: 314 lbs Today's date: 03/29/2021 Total lbs lost to date: 9 Total lbs lost since last in-office visit: 11  Interim History: Becky Perry has done well with weight loss since her last visit. She hasn't been following Category 1 plan. She hasn't returned for a follow up because she "have been cheating". She is trying to make smart choices and she is watching her portion sizes. She is cravings sugar, especially in her coffee. She is drinking 3 cups of coffee daily. She rarely drinks water. She hasn't been exercising but she plans to start back exercising in the pool. She struggles with hip pain, and she is presently in a wheelchair today. She plans to try to cut out sugar and butter, and she plans to continue smart choices and portion control.   Subjective:   1. Type 2 diabetes mellitus with other specified complication, without long-term current use of insulin (HCC) Becky Perry is taking Ozempic 0.25 mg and metformin 500 mg daily. She is not checking her blood sugar at home, and she denies hypoglycemia.   2. Vitamin D deficiency Becky Perry is not currently taking Vit D. She plans to have labs at her primary care physician's office in the next couple of weeks.   3. Stage 3a chronic kidney disease (HCC) Becky Perry drinks very limited water. Last labs were done 07/28/2020.  Assessment/Plan:   1. Type 2 diabetes mellitus with other specified complication, without long-term current use of insulin (HCC) Becky Perry is to make an appointment with her primary care physician for a follow up and labs. She will continue  working on dietary changes, exercise, and weight loss. Good blood sugar control is important to decrease the likelihood of diabetic complications such as nephropathy, neuropathy, limb loss, blindness, coronary artery disease, and death. Intensive lifestyle modification including diet, exercise and weight loss are the first line of treatment for diabetes.   2. Vitamin D deficiency Low Vitamin D level contributes to fatigue and are associated with obesity, breast, and colon cancer. Becky Perry is to follow up with her primary care physician for labs. She is not fasting today. She will follow-up for routine testing of Vitamin D, at least 2-3 times per year to avoid over-replacement.  3. Stage 3a chronic kidney disease (HCC) Becky Perry will increase her water intake.She is to call and schedule an appointment with her primary care physician for a follow up and labs.  4. Obesity with current BMI of 52.5 Becky Perry is currently in the action stage of change. As such, her goal is to continue with weight loss efforts. She has agreed to the Category 1 Plan.   Exercise goals: Pool exercise.  Behavioral modification strategies: increasing lean protein intake, increasing water intake, planning for success, and decreasing sugar and butter.  Becky Perry has agreed to follow-up with our clinic in 8 weeks. She was informed of the importance of frequent follow-up visits to maximize her success with intensive lifestyle modifications for her multiple health conditions.   Objective:   Blood pressure 102/61, pulse 74, temperature 98.1 F (36.7 C), height 5\' 6"  (1.676 m), weight (!) 314  lb (142.4 kg), SpO2 100 %. Body mass index is 50.68 kg/m.  General: Cooperative, alert, well developed, in no acute distress. HEENT: Conjunctivae and lids unremarkable. Cardiovascular: Regular rhythm.  Lungs: Normal work of breathing. Neurologic: No focal deficits.   Lab Results  Component Value Date   CREATININE 1.34 (H) 07/27/2020   BUN 22  07/27/2020   NA 139 07/27/2020   K 4.5 07/27/2020   CL 102 07/27/2020   CO2 25 07/27/2020   Lab Results  Component Value Date   ALT 27 07/27/2020   AST 21 07/27/2020   ALKPHOS 85 07/27/2020   BILITOT 0.4 07/27/2020   Lab Results  Component Value Date   HGBA1C 4.7 (L) 07/27/2020   HGBA1C 6.8 10/13/2019   HGBA1C 10.2 (H) 01/01/2014   Lab Results  Component Value Date   INSULIN 32.0 (H) 07/27/2020   Lab Results  Component Value Date   TSH 4.300 07/27/2020   Lab Results  Component Value Date   CHOL 107 07/27/2020   HDL 58 07/27/2020   LDLCALC 30 07/27/2020   TRIG 103 07/27/2020   Lab Results  Component Value Date   VD25OH 38.4 07/27/2020   Lab Results  Component Value Date   WBC 9.1 07/27/2020   HGB 14.7 07/27/2020   HCT 44.6 07/27/2020   MCV 93 07/27/2020   PLT 317 07/27/2020   No results found for: IRON, TIBC, FERRITIN  Obesity Behavioral Intervention:   Approximately 15 minutes were spent on the discussion below.  ASK: We discussed the diagnosis of obesity with Becky Perry today and Becky Perry agreed to give [place] permission to discuss obesity behavioral modification therapy today.  ASSESS: Becky Perry has the diagnosis of obesity and her BMI today is 52.5. Becky Perry is in the action stage of change.   ADVISE: Becky Perry was educated on the multiple health risks of obesity as well as the benefit of weight loss to improve her health. She was advised of the need for long term treatment and the importance of lifestyle modifications to improve her current health and to decrease her risk of future health problems.  AGREE: Multiple dietary modification options and treatment options were discussed and Becky Perry agreed to follow the recommendations documented in the above note.  ARRANGE: Becky Perry was educated on the importance of frequent visits to treat obesity as outlined per CMS and USPSTF guidelines and agreed to schedule her next follow up appointment today.  Attestation Statements:    Reviewed by clinician on day of visit: allergies, medications, problem list, medical history, surgical history, family history, social history, and previous encounter notes.   I, Becka Lagasse, am acting as transcriptionist for Quillian Quince, MD.  I have reviewed the above documentation for accuracy and completeness, and I agree with the above. -  Quillian Quince, MD

## 2021-05-18 ENCOUNTER — Encounter (INDEPENDENT_AMBULATORY_CARE_PROVIDER_SITE_OTHER): Payer: Self-pay | Admitting: Family Medicine

## 2021-05-18 NOTE — Telephone Encounter (Signed)
Please advise 

## 2021-05-23 ENCOUNTER — Ambulatory Visit (INDEPENDENT_AMBULATORY_CARE_PROVIDER_SITE_OTHER): Payer: Medicare Other | Admitting: Family Medicine

## 2021-05-23 VITALS — BP 117/82 | HR 83 | Temp 97.4°F | Ht 66.0 in | Wt 303.0 lb

## 2021-05-23 DIAGNOSIS — E669 Obesity, unspecified: Secondary | ICD-10-CM

## 2021-05-23 DIAGNOSIS — Z6841 Body Mass Index (BMI) 40.0 and over, adult: Secondary | ICD-10-CM

## 2021-05-23 DIAGNOSIS — Z7985 Long-term (current) use of injectable non-insulin antidiabetic drugs: Secondary | ICD-10-CM

## 2021-05-23 DIAGNOSIS — E1169 Type 2 diabetes mellitus with other specified complication: Secondary | ICD-10-CM

## 2021-06-02 NOTE — Progress Notes (Signed)
? ? ? ?Chief Complaint:  ? ?OBESITY ?Becky Perry is here to discuss her progress with her obesity treatment plan along with follow-up of her obesity related diagnoses. [name] is on the Category 1 Plan and states she is following her eating plan approximately 0% of the time. [name] states she is doing chair exercise 2-3 times per week.  ? ?Today's visit was #: 5 ?Starting weight: 323 lbs ?Starting date: 07/27/2020 ?Today's weight: 303 lbs ?Today's date: 05/23/2021 ?Total lbs lost to date: 42 ?Total lbs lost since last in-office visit: 11 ? ?Interim History: [name] continues to work on weight loss. Her motivation to make changes has waxed and waned. She is ready to work on her weight loss again.  ? ?Subjective:  ? ?1. Type 2 diabetes mellitus with other specified complication, without long-term current use of insulin (Becky Perry) ?[name] increased her Ozempic to 0.5 mg by her PCP. She is getting to increase her dose to 1 mg. ? ?Assessment/Plan:  ? ?1. Type 2 diabetes mellitus with other specified complication, without long-term current use of insulin (Becky Perry) ?[name] was warned about decreasing calories too low, which will decreases her RMR. Her goal  is to not decrease calories below 1200 daily. ? ?2. Obesity with current BMI of 48.93 ?[name] is currently in the action stage of change. As such, her goal is to continue with weight loss efforts. She has agreed to keeping a food journal and adhering to recommended goals of 1200-1400 calories and 80+ grams of protein daily.  ? ?Exercise goals: For substantial health benefits, adults should do at least 150 minutes (2 hours and 30 minutes) a week of moderate-intensity, or 75 minutes (1 hour and 15 minutes) a week of vigorous-intensity aerobic physical activity, or an equivalent combination of moderate- and vigorous-intensity aerobic activity. Aerobic activity should be performed in episodes of at least 10 minutes, and preferably, it should be spread throughout the week. ? ?Behavioral  modification strategies: increasing lean protein intake and meal planning and cooking strategies. ? ?[name] has agreed to follow-up with our clinic in 8 weeks. She was informed of the importance of frequent follow-up visits to maximize her success with intensive lifestyle modifications for her multiple health conditions.  ? ?Objective:  ? ?Blood pressure 117/82, pulse 83, temperature (!) 97.4 ?F (36.3 ?C), temperature source Oral, height 5\' 6"  (1.676 m), weight (!) 303 lb (137.4 kg), SpO2 97 %. ?Body mass index is 48.91 kg/m?. ? ?General: Cooperative, alert, well developed, in no acute distress. ?HEENT: Conjunctivae and lids unremarkable. ?Cardiovascular: Regular rhythm.  ?Lungs: Normal work of breathing. ?Neurologic: No focal deficits.  ? ?Lab Results  ?Component Value Date  ? CREATININE 1.34 (H) 07/27/2020  ? BUN 22 07/27/2020  ? NA 139 07/27/2020  ? K 4.5 07/27/2020  ? CL 102 07/27/2020  ? CO2 25 07/27/2020  ? ?Lab Results  ?Component Value Date  ? ALT 27 07/27/2020  ? AST 21 07/27/2020  ? ALKPHOS 85 07/27/2020  ? BILITOT 0.4 07/27/2020  ? ?Lab Results  ?Component Value Date  ? HGBA1C 4.7 (L) 07/27/2020  ? HGBA1C 6.8 10/13/2019  ? HGBA1C 10.2 (H) 01/01/2014  ? ?Lab Results  ?Component Value Date  ? INSULIN 32.0 (H) 07/27/2020  ? ?Lab Results  ?Component Value Date  ? TSH 4.300 07/27/2020  ? ?Lab Results  ?Component Value Date  ? CHOL 107 07/27/2020  ? HDL 58 07/27/2020  ? [place] 30 07/27/2020  ? TRIG 103 07/27/2020  ? ?Lab Results  ?Component Value Date  ?  VD25OH 38.4 07/27/2020  ? ?Lab Results  ?Component Value Date  ? WBC 9.1 07/27/2020  ? HGB 14.7 07/27/2020  ? HCT 44.6 07/27/2020  ? MCV 93 07/27/2020  ? PLT 317 07/27/2020  ? ?No results found for: IRON, TIBC, FERRITIN ? ?Attestation Statements:  ? ?Reviewed by clinician on day of visit: allergies, medications, problem list, medical history, surgical history, family history, social history, and previous encounter notes. ? ?Time spent on visit including  pre-visit chart review and post-visit care and charting was 35 minutes.  ? ? ?I, Ellamarie Triano, am acting as transcriptionist for Dennard Nip, MD. ? ?I have reviewed the above documentation for accuracy and completeness, and I agree with the above. -  Dennard Nip, MD ? ? ?

## 2021-07-11 ENCOUNTER — Encounter (INDEPENDENT_AMBULATORY_CARE_PROVIDER_SITE_OTHER): Payer: Self-pay | Admitting: Family Medicine

## 2021-07-11 ENCOUNTER — Ambulatory Visit (INDEPENDENT_AMBULATORY_CARE_PROVIDER_SITE_OTHER): Payer: Medicare Other | Admitting: Family Medicine

## 2021-07-11 VITALS — BP 118/72 | HR 74 | Temp 97.9°F | Ht 66.0 in | Wt 306.4 lb

## 2021-07-11 DIAGNOSIS — Z6841 Body Mass Index (BMI) 40.0 and over, adult: Secondary | ICD-10-CM

## 2021-07-11 DIAGNOSIS — E559 Vitamin D deficiency, unspecified: Secondary | ICD-10-CM

## 2021-07-11 DIAGNOSIS — E66813 Obesity, class 3: Secondary | ICD-10-CM

## 2021-07-11 DIAGNOSIS — E669 Obesity, unspecified: Secondary | ICD-10-CM

## 2021-07-11 DIAGNOSIS — Z7985 Long-term (current) use of injectable non-insulin antidiabetic drugs: Secondary | ICD-10-CM

## 2021-07-11 DIAGNOSIS — E1169 Type 2 diabetes mellitus with other specified complication: Secondary | ICD-10-CM | POA: Diagnosis not present

## 2021-07-11 MED ORDER — OZEMPIC (0.25 OR 0.5 MG/DOSE) 2 MG/1.5ML ~~LOC~~ SOPN: PEN_INJECTOR | SUBCUTANEOUS | Status: DC

## 2021-07-11 MED ORDER — VITAMIN D (ERGOCALCIFEROL) 1.25 MG (50000 UNIT) PO CAPS: 50000.0000 [IU] | ORAL_CAPSULE | ORAL | 0 refills | Status: AC

## 2021-07-11 NOTE — Progress Notes (Unsigned)
Chief Complaint:   OBESITY Becky Perry is here to discuss her progress with her obesity treatment plan along with follow-up of her obesity related diagnoses. Becky Perry is on keeping a food journal and adhering to recommended goals of 1200-1400 calories and 80+ grams of protein daily and states she is following her eating plan approximately 0% of the time. Becky Perry states she is doing 0 minutes 0 times per week.  Today's visit was #: 6 Starting weight: 323 lbs Starting date: 07/27/2020 Today's weight: 306 lbs Today's date: 07/11/2021 Total lbs lost to date: 17 Total lbs lost since last in-office visit: 0  Interim History: Becky Perry is working on increasing her protein, but she is using a lot of protein supplements versus using real food.  She is having dental issues and she is struggling to eat meat.  Subjective:   1. Vitamin D deficiency Becky Perry is on vitamin D, but her level is not yet at goal.  She has no signs of over replacement.  2. Type 2 diabetes mellitus with other specified complication, without long-term current use of insulin (HCC) Becky Perry is out of Ozempic due to finances and being in the donut hole.  She notes some increase in acid reflux.  Assessment/Plan:   1. Vitamin D deficiency Low Vitamin D level contributes to fatigue and are associated with obesity, breast, and colon cancer. We will refill prescription Vitamin D for 1 month. Becky Perry will follow-up for routine testing of Vitamin D, at least 2-3 times per year to avoid over-replacement.  - Vitamin D, Ergocalciferol, (DRISDOL) 1.25 MG (50000 UNIT) CAPS capsule; Take 1 capsule (50,000 Units total) by mouth every 7 (seven) days.  Dispense: 4 capsule; Refill: 0  2. Type 2 diabetes mellitus with other specified complication, without long-term current use of insulin (HCC) We will refill Ozempic 0.25 mg weekly for 1 month.  (Cannot increase due to side effects). good blood sugar control is important to decrease the likelihood of  diabetic complications such as nephropathy, neuropathy, limb loss, blindness, coronary artery disease, and death. Intensive lifestyle modification including diet, exercise and weight loss are the first line of treatment for diabetes.   - Semaglutide,0.25 or 0.5MG /DOS, (OZEMPIC, 0.25 OR 0.5 MG/DOSE,) 2 MG/1.5ML SOPN; Dec dose to 0.25mg  wkly  3. Obesity, Current BMI 49.46 Becky Perry is currently in the action stage of change. As such, her goal is to continue with weight loss efforts. She has agreed to keeping a food journal and adhering to recommended goals of 1200-1400 calories and 80+ grams of protein daily.   Soft protein foods were discussed, such as yogurt, cottage cheese, milk, fish, and light cheeses.   Behavioral modification strategies: increasing lean protein intake and meal planning and cooking strategies.  Becky Perry has agreed to follow-up with our clinic in 3 to 4 weeks. She was informed of the importance of frequent follow-up visits to maximize her success with intensive lifestyle modifications for her multiple health conditions.   Objective:   Blood pressure 118/72, pulse 74, temperature 97.9 F (36.6 C), height 5\' 6"  (1.676 m), weight (!) 306 lb 7 oz (139 kg), SpO2 99 %. Body mass index is 49.46 kg/m.  General: Cooperative, alert, well developed, in no acute distress. HEENT: Conjunctivae and lids unremarkable. Cardiovascular: Regular rhythm.  Lungs: Normal work of breathing. Neurologic: No focal deficits.   Lab Results  Component Value Date   CREATININE 1.34 (H) 07/27/2020   BUN 22 07/27/2020   NA 139 07/27/2020   K 4.5 07/27/2020  CL 102 07/27/2020   CO2 25 07/27/2020   Lab Results  Component Value Date   ALT 27 07/27/2020   AST 21 07/27/2020   ALKPHOS 85 07/27/2020   BILITOT 0.4 07/27/2020   Lab Results  Component Value Date   HGBA1C 4.7 (L) 07/27/2020   HGBA1C 6.8 10/13/2019   HGBA1C 10.2 (H) 01/01/2014   Lab Results  Component Value Date   INSULIN 32.0  (H) 07/27/2020   Lab Results  Component Value Date   TSH 4.300 07/27/2020   Lab Results  Component Value Date   CHOL 107 07/27/2020   HDL 58 07/27/2020   LDLCALC 30 07/27/2020   TRIG 103 07/27/2020   Lab Results  Component Value Date   VD25OH 38.4 07/27/2020   Lab Results  Component Value Date   WBC 9.1 07/27/2020   HGB 14.7 07/27/2020   HCT 44.6 07/27/2020   MCV 93 07/27/2020   PLT 317 07/27/2020   No results found for: "IRON", "TIBC", "FERRITIN"  Obesity Behavioral Intervention:   Approximately 15 minutes were spent on the discussion below.  ASK: We discussed the diagnosis of obesity with Becky Perry today and Becky Perry agreed to give [place] permission to discuss obesity behavioral modification therapy today.  ASSESS: Becky Perry has the diagnosis of obesity and her BMI today is 49.46. Becky Perry is in the action stage of change.   ADVISE: Becky Perry was educated on the multiple health risks of obesity as well as the benefit of weight loss to improve her health. She was advised of the need for long term treatment and the importance of lifestyle modifications to improve her current health and to decrease her risk of future health problems.  AGREE: Multiple dietary modification options and treatment options were discussed and Becky Perry agreed to follow the recommendations documented in the above note.  ARRANGE: Becky Perry was educated on the importance of frequent visits to treat obesity as outlined per CMS and USPSTF guidelines and agreed to schedule her next follow up appointment today.  Attestation Statements:   Reviewed by clinician on day of visit: allergies, medications, problem list, medical history, surgical history, family history, social history, and previous encounter notes.   I, Arlett Goold, am acting as transcriptionist for Quillian Quince, MD.  I have reviewed the above documentation for accuracy and completeness, and I agree with the above. -  Quillian Quince, MD

## 2021-08-08 ENCOUNTER — Ambulatory Visit (INDEPENDENT_AMBULATORY_CARE_PROVIDER_SITE_OTHER): Payer: Medicare Other | Admitting: Family Medicine

## 2021-09-07 ENCOUNTER — Encounter (INDEPENDENT_AMBULATORY_CARE_PROVIDER_SITE_OTHER): Payer: Self-pay

## 2022-06-19 ENCOUNTER — Emergency Department: Payer: Medicare Other

## 2022-06-19 ENCOUNTER — Other Ambulatory Visit: Payer: Self-pay

## 2022-06-19 ENCOUNTER — Emergency Department
Admission: EM | Admit: 2022-06-19 | Discharge: 2022-06-23 | Disposition: A | Discharge status: Home / Self Care | Payer: Medicare Other | Attending: Emergency Medicine | Admitting: Emergency Medicine

## 2022-06-19 DIAGNOSIS — M545 Low back pain, unspecified: Secondary | ICD-10-CM | POA: Insufficient documentation

## 2022-06-19 DIAGNOSIS — M25551 Pain in right hip: Secondary | ICD-10-CM | POA: Insufficient documentation

## 2022-06-19 DIAGNOSIS — K5730 Diverticulosis of large intestine without perforation or abscess without bleeding: Secondary | ICD-10-CM | POA: Diagnosis not present

## 2022-06-19 DIAGNOSIS — R5381 Other malaise: Secondary | ICD-10-CM | POA: Diagnosis not present

## 2022-06-19 DIAGNOSIS — W19XXXA Unspecified fall, initial encounter: Secondary | ICD-10-CM

## 2022-06-19 DIAGNOSIS — I491 Atrial premature depolarization: Secondary | ICD-10-CM | POA: Insufficient documentation

## 2022-06-19 DIAGNOSIS — Z7984 Long term (current) use of oral hypoglycemic drugs: Secondary | ICD-10-CM | POA: Diagnosis not present

## 2022-06-19 DIAGNOSIS — E119 Type 2 diabetes mellitus without complications: Secondary | ICD-10-CM | POA: Insufficient documentation

## 2022-06-19 DIAGNOSIS — J449 Chronic obstructive pulmonary disease, unspecified: Secondary | ICD-10-CM | POA: Diagnosis not present

## 2022-06-19 DIAGNOSIS — Z7985 Long-term (current) use of injectable non-insulin antidiabetic drugs: Secondary | ICD-10-CM | POA: Diagnosis not present

## 2022-06-19 DIAGNOSIS — R531 Weakness: Secondary | ICD-10-CM | POA: Insufficient documentation

## 2022-06-19 DIAGNOSIS — Z6841 Body Mass Index (BMI) 40.0 and over, adult: Secondary | ICD-10-CM | POA: Insufficient documentation

## 2022-06-19 DIAGNOSIS — Z79899 Other long term (current) drug therapy: Secondary | ICD-10-CM | POA: Insufficient documentation

## 2022-06-19 DIAGNOSIS — Z302 Encounter for sterilization: Secondary | ICD-10-CM | POA: Diagnosis not present

## 2022-06-19 DIAGNOSIS — F32A Depression, unspecified: Secondary | ICD-10-CM | POA: Insufficient documentation

## 2022-06-19 DIAGNOSIS — M160 Bilateral primary osteoarthritis of hip: Secondary | ICD-10-CM | POA: Diagnosis not present

## 2022-06-19 DIAGNOSIS — F1111 Opioid abuse, in remission: Secondary | ICD-10-CM | POA: Insufficient documentation

## 2022-06-19 DIAGNOSIS — W010XXA Fall on same level from slipping, tripping and stumbling without subsequent striking against object, initial encounter: Secondary | ICD-10-CM | POA: Diagnosis not present

## 2022-06-19 DIAGNOSIS — M25552 Pain in left hip: Secondary | ICD-10-CM | POA: Insufficient documentation

## 2022-06-19 LAB — URINALYSIS, ROUTINE W REFLEX MICROSCOPIC
Bacteria, UA: NONE SEEN
Bilirubin Urine: NEGATIVE
Glucose, UA: NEGATIVE mg/dL
Hgb urine dipstick: NEGATIVE
Ketones, ur: NEGATIVE mg/dL
Nitrite: NEGATIVE
Protein, ur: NEGATIVE mg/dL
Specific Gravity, Urine: 1.027 (ref 1.005–1.030)
pH: 5 (ref 5.0–8.0)

## 2022-06-19 LAB — CBC WITH DIFFERENTIAL/PLATELET
Abs Immature Granulocytes: 0.02 10*3/uL (ref 0.00–0.07)
Basophils Absolute: 0.1 10*3/uL (ref 0.0–0.1)
Basophils Relative: 1 %
Eosinophils Absolute: 0.2 10*3/uL (ref 0.0–0.5)
Eosinophils Relative: 3 %
HCT: 42.3 % (ref 36.0–46.0)
Hemoglobin: 13.3 g/dL (ref 12.0–15.0)
Immature Granulocytes: 0 %
Lymphocytes Relative: 27 %
Lymphs Abs: 2.2 10*3/uL (ref 0.7–4.0)
MCH: 29.2 pg (ref 26.0–34.0)
MCHC: 31.4 g/dL (ref 30.0–36.0)
MCV: 93 fL (ref 80.0–100.0)
Monocytes Absolute: 0.5 10*3/uL (ref 0.1–1.0)
Monocytes Relative: 7 %
Neutro Abs: 5.2 10*3/uL (ref 1.7–7.7)
Neutrophils Relative %: 62 %
Platelets: 361 10*3/uL (ref 150–400)
RBC: 4.55 MIL/uL (ref 3.87–5.11)
RDW: 14.3 % (ref 11.5–15.5)
WBC: 8.2 10*3/uL (ref 4.0–10.5)
nRBC: 0 % (ref 0.0–0.2)

## 2022-06-19 LAB — COMPREHENSIVE METABOLIC PANEL
ALT: 16 U/L (ref 0–44)
AST: 21 U/L (ref 15–41)
Albumin: 4 g/dL (ref 3.5–5.0)
Alkaline Phosphatase: 76 U/L (ref 38–126)
Anion gap: 10 (ref 5–15)
BUN: 13 mg/dL (ref 8–23)
CO2: 25 mmol/L (ref 22–32)
Calcium: 9.2 mg/dL (ref 8.9–10.3)
Chloride: 104 mmol/L (ref 98–111)
Creatinine, Ser: 0.81 mg/dL (ref 0.44–1.00)
GFR, Estimated: >60 mL/min (ref >60)
Glucose, Bld: 85 mg/dL (ref 70–99)
Potassium: 3.6 mmol/L (ref 3.5–5.1)
Sodium: 139 mmol/L (ref 135–145)
Total Bilirubin: 0.7 mg/dL (ref 0.3–1.2)
Total Protein: 7.6 g/dL (ref 6.5–8.1)

## 2022-06-19 LAB — CBG MONITORING, ED: Glucose-Capillary: 82 mg/dL (ref 70–99)

## 2022-06-19 LAB — CK: Total CK: 86 U/L (ref 38–234)

## 2022-06-19 MED ORDER — ACETAMINOPHEN 325 MG PO TABS
650.0000 mg | ORAL_TABLET | Freq: Once | ORAL | Status: AC
  Administered 2022-06-19: 650 mg via ORAL
  Filled 2022-06-19: qty 2

## 2022-06-19 MED ORDER — ALBUTEROL SULFATE (2.5 MG/3ML) 0.083% IN NEBU
2.5000 mg | INHALATION_SOLUTION | Freq: Once | RESPIRATORY_TRACT | Status: AC
  Administered 2022-06-20: 2.5 mg via RESPIRATORY_TRACT
  Filled 2022-06-19: qty 3

## 2022-06-19 NOTE — ED Notes (Signed)
ED Provider at bedside. 

## 2022-06-19 NOTE — ED Triage Notes (Addendum)
Pt arrived via EMS from home. Pt sts " I fell and pissed myself. I am unable to walk." I am unable to take care of myself anymore. I am afraid to go back home. Pt sts that [employer] brings her food for one meal a day and pt sts that on the weekend a neighbor brings her food but pt sts that she is eating snacks all in between meals.

## 2022-06-19 NOTE — ED Notes (Signed)
No wounds noted to pt bottom, pt unable to spread legs open without RN assistance to manually open her leg, no wounds noted upon what was able to be seen. Pt did have odor, RN attempted to wipe pt as much as possible.

## 2022-06-19 NOTE — ED Provider Notes (Signed)
[facility] Provider Note    Event Date/Time   First MD Initiated Contact with Patient 06/19/22 2033     (approximate)   History   Fall   HPI  Becky Perry is a 67 y.o. female  with history of T2DM presenting to the emergency department for evaluation after a fall.  Patient reports that she has bilateral hip arthritis and is currently attempting weight loss before surgery.  However, she reports that over the past several months she has had significant progressive weakness.  She reports that she spends all day in her recliner, has a portable toilet, but is struggling to take even a few feet to that.  Today, she stood up to try to go to the bathroom, tripped and fell onto her back and hips.  She had to crawl to trying to get help.  She reports significant hip pain.  She is concerned about her ability to care for self at home.  She denies hitting her head.  No LOC.     Physical Exam   Triage Vital Signs: ED Triage Vitals  Enc Vitals Group     BP 06/19/22 1823 (!) 147/74     Pulse Rate 06/19/22 1823 75     Resp 06/19/22 1823 18     Temp 06/19/22 1823 98.4 F (36.9 C)     Temp Source 06/19/22 1823 Oral     SpO2 06/19/22 1823 100 %     Weight 06/19/22 1824 280 lb (127 kg)     Height 06/19/22 1824 5\' 6"  (1.676 m)     Head Circumference --      Peak Flow --      Pain Score --      Pain Loc --      Pain Edu? --      Excl. in GC? --     Most recent vital signs: Vitals:   06/19/22 2100 06/19/22 2309  BP: (!) 155/100 (!) 156/109  Pulse: 93   Resp: 18 18  Temp:    SpO2: 98% 98%   Nursing notes and vital signs reviewed.  General: Adult female, laying in bed, awake, interactive Head: Atraumatic Chest: Symmetric chest rise, no tenderness to palpation.  Cardiac: Regular rhythm and rate.  Respiratory: Lungs clear to auscultation Abdomen: Soft, nondistended. No tenderness to palpation.  Pelvis: Stable in AP and lateral compression. Tenderness  palpation over the bilateral hip.   MSK: No deformity to bilateral upper and lower extremity.  Full range of motion of the upper extremities.  Limited range of motion and significant pain with attempts at hip flexion of the bilateral lower extremities.  Tenderness to palpation over the lumbar spine. Neuro: Alert, oriented. GCS 15.Normal sensation to light touch in bilateral upper and lower extremity. Skin: No evidence of burns or lacerations.  ED Results / Procedures / Treatments   Labs (all labs ordered are listed, but only abnormal results are displayed) Labs Reviewed  URINALYSIS, ROUTINE W REFLEX MICROSCOPIC - Abnormal; Notable for the following components:      Result Value   Color, Urine YELLOW (*)    APPearance CLEAR (*)    Leukocytes,Ua TRACE (*)    All other components within normal limits  COMPREHENSIVE METABOLIC PANEL  CBC WITH DIFFERENTIAL/PLATELET  CK  CBG MONITORING, ED     EKG EKG independently reviewed interpreted by myself (ER attending) demonstrates:  EKG demonstrates sinus rhythm at a rate of 83, PR 163, QRS 93, QTc 433,  no acute ST changes  RADIOLOGY Imaging independently reviewed and interpreted by myself demonstrates:  CT head without acute bleed CT C-spine without acute fracture   PROCEDURES:  Critical Care performed: No  Procedures   MEDICATIONS ORDERED IN ED: Medications  albuterol (PROVENTIL) (2.5 MG/3ML) 0.083% nebulizer solution 2.5 mg (has no administration in time range)  acetaminophen (TYLENOL) tablet 650 mg (650 mg Oral Given 06/19/22 2132)     IMPRESSION / MDM / ASSESSMENT AND PLAN / ED COURSE  I reviewed the triage vital signs and the nursing notes.  Differential diagnosis includes, but is not limited to, hip fracture, lumbar fracture, anemia, electrolyte abnormality  Patient's presentation is most consistent with acute presentation with potential threat to life or bodily function.  67 year old female presenting to the emergency  department for evaluation of a fall in the setting of progressive weakness.  Lab work overall reassuring.  Imaging initially ordered from triage including CT head and C-spine, completed prior to my initial evaluation without acute injury.  Given acute pain over the hip and lumbar region, will obtain plain films of these areas.  Care of this patient signed out to oncoming provider at 2310 pending imaging, reevaluation, and disposition.      FINAL CLINICAL IMPRESSION(S) / ED DIAGNOSES   Final diagnoses:  Bilateral hip pain  Lumbar pain  Generalized weakness     Rx / DC Orders   ED Discharge Orders     None        Note:  This document was prepared using Dragon voice recognition software and may include unintentional dictation errors.   Trinna Post, MD 06/19/22 [phone]

## 2022-06-20 ENCOUNTER — Emergency Department: Payer: Medicare Other

## 2022-06-20 DIAGNOSIS — M160 Bilateral primary osteoarthritis of hip: Secondary | ICD-10-CM

## 2022-06-20 DIAGNOSIS — W19XXXA Unspecified fall, initial encounter: Secondary | ICD-10-CM | POA: Diagnosis not present

## 2022-06-20 DIAGNOSIS — R5381 Other malaise: Secondary | ICD-10-CM | POA: Diagnosis not present

## 2022-06-20 DIAGNOSIS — E119 Type 2 diabetes mellitus without complications: Secondary | ICD-10-CM | POA: Diagnosis not present

## 2022-06-20 DIAGNOSIS — Y92009 Unspecified place in unspecified non-institutional (private) residence as the place of occurrence of the external cause: Secondary | ICD-10-CM | POA: Diagnosis not present

## 2022-06-20 DIAGNOSIS — F1111 Opioid abuse, in remission: Secondary | ICD-10-CM

## 2022-06-20 DIAGNOSIS — F32A Depression, unspecified: Secondary | ICD-10-CM

## 2022-06-20 MED ORDER — GABAPENTIN 100 MG PO CAPS
100.0000 mg | ORAL_CAPSULE | Freq: Once | ORAL | Status: AC
  Administered 2022-06-20: 100 mg via ORAL
  Filled 2022-06-20: qty 1

## 2022-06-20 MED ORDER — KETOROLAC TROMETHAMINE 30 MG/ML IJ SOLN
15.0000 mg | Freq: Once | INTRAMUSCULAR | Status: AC
  Administered 2022-06-20: 15 mg via INTRAVENOUS
  Filled 2022-06-20: qty 1

## 2022-06-20 MED ORDER — ENOXAPARIN SODIUM 80 MG/0.8ML IJ SOSY
0.5000 mg/kg | PREFILLED_SYRINGE | INTRAMUSCULAR | Status: DC
  Administered 2022-06-20 – 2022-06-22 (×3): 62.5 mg via SUBCUTANEOUS
  Filled 2022-06-20 (×4): qty 0.63

## 2022-06-20 MED ORDER — ACETAMINOPHEN 500 MG PO TABS
1000.0000 mg | ORAL_TABLET | Freq: Four times a day (QID) | ORAL | Status: DC
  Administered 2022-06-20 – 2022-06-23 (×14): 1000 mg via ORAL
  Filled 2022-06-20 (×13): qty 2

## 2022-06-20 MED ORDER — ACETAMINOPHEN 325 MG PO TABS: 650.0000 mg | ORAL_TABLET | Freq: Four times a day (QID) | ORAL | Status: DC | PRN

## 2022-06-20 MED ORDER — MELATONIN 5 MG PO TABS
10.0000 mg | ORAL_TABLET | Freq: Every evening | ORAL | Status: DC | PRN
  Administered 2022-06-22: 10 mg via ORAL
  Filled 2022-06-20 (×2): qty 2

## 2022-06-20 MED ORDER — GABAPENTIN 300 MG PO CAPS
300.0000 mg | ORAL_CAPSULE | Freq: Two times a day (BID) | ORAL | Status: DC
  Administered 2022-06-20 – 2022-06-23 (×7): 300 mg via ORAL
  Filled 2022-06-20 (×7): qty 1

## 2022-06-20 MED ORDER — IBUPROFEN 400 MG PO TABS
400.0000 mg | ORAL_TABLET | Freq: Four times a day (QID) | ORAL | Status: DC
  Administered 2022-06-20 – 2022-06-23 (×11): 400 mg via ORAL
  Filled 2022-06-20 (×12): qty 1

## 2022-06-20 MED ORDER — IPRATROPIUM-ALBUTEROL 0.5-2.5 (3) MG/3ML IN SOLN
3.0000 mL | Freq: Four times a day (QID) | RESPIRATORY_TRACT | Status: DC | PRN
  Administered 2022-06-21: 3 mL via RESPIRATORY_TRACT
  Filled 2022-06-20: qty 3

## 2022-06-20 MED ORDER — METFORMIN HCL 500 MG PO TABS
500.0000 mg | ORAL_TABLET | Freq: Every day | ORAL | Status: DC
  Administered 2022-06-20 – 2022-06-23 (×4): 500 mg via ORAL
  Filled 2022-06-20 (×4): qty 1

## 2022-06-20 MED ORDER — OXYCODONE HCL 5 MG PO TABS
5.0000 mg | ORAL_TABLET | Freq: Once | ORAL | Status: DC
  Filled 2022-06-20: qty 1

## 2022-06-20 MED ORDER — GABAPENTIN 300 MG PO CAPS: 300.0000 mg | ORAL_CAPSULE | Freq: Every day | ORAL | Status: DC

## 2022-06-20 MED ORDER — MORPHINE SULFATE (PF) 2 MG/ML IV SOLN
2.0000 mg | Freq: Once | INTRAVENOUS | Status: DC
  Filled 2022-06-20: qty 1

## 2022-06-20 MED ORDER — BUPROPION HCL ER (SR) 100 MG PO TB12
200.0000 mg | ORAL_TABLET | Freq: Every morning | ORAL | Status: DC
  Administered 2022-06-20 – 2022-06-23 (×4): 200 mg via ORAL
  Filled 2022-06-20 (×4): qty 2

## 2022-06-20 MED ORDER — METHOCARBAMOL 500 MG PO TABS
500.0000 mg | ORAL_TABLET | Freq: Once | ORAL | Status: AC
  Administered 2022-06-20: 500 mg via ORAL
  Filled 2022-06-20: qty 1

## 2022-06-20 MED ORDER — FAMOTIDINE 20 MG PO TABS
20.0000 mg | ORAL_TABLET | Freq: Two times a day (BID) | ORAL | Status: DC
  Administered 2022-06-20 – 2022-06-23 (×7): 20 mg via ORAL
  Filled 2022-06-20 (×7): qty 1

## 2022-06-20 MED ORDER — AMLODIPINE BESYLATE 5 MG PO TABS
5.0000 mg | ORAL_TABLET | Freq: Every day | ORAL | Status: DC
  Administered 2022-06-20 – 2022-06-23 (×4): 5 mg via ORAL
  Filled 2022-06-20 (×4): qty 1

## 2022-06-20 NOTE — Assessment & Plan Note (Signed)
Pt remains on Ozempic. Pt recently had 3 month supply of her medication delivered to her. Pt can use her own supply of Ozempic at SNF.

## 2022-06-20 NOTE — Progress Notes (Signed)
PHARMACIST - PHYSICIAN COMMUNICATION  CONCERNING:  Enoxaparin (Lovenox) for DVT Prophylaxis    RECOMMENDATION: Patient was prescribed enoxaprin 40mg  q24 hours for VTE prophylaxis.   Filed Weights   06/19/22 1824  Weight: 127 kg (280 lb)    Body mass index is 45.19 kg/m.  Estimated Creatinine Clearance: 90.7 mL/min (by C-G formula based on SCr of 0.81 mg/dL).   Based on [facility] [place] policy patient is candidate for enoxaparin 0.5mg /kg TBW SQ every 24 hours based on BMI being >30.  DESCRIPTION: Pharmacy has adjusted enoxaparin dose per [facility] policy.  Patient is now receiving enoxaparin 62.5 mg every 24 hours    Bettey Costa, PharmD Clinical Pharmacist  06/20/2022 2:38 PM

## 2022-06-20 NOTE — ED Notes (Signed)
Patient transported to CT 

## 2022-06-20 NOTE — ED Notes (Signed)
Alert and oriented this morning. Requested warm blankets. 2 warm blankets applied to patient. Noted Purwick in place draining dark yellow urine.

## 2022-06-20 NOTE — Progress Notes (Signed)
PROGRESS NOTE   HPI was taken from Dr. 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  Patient has been relegated to sleeping in a recliner for the last 7 weeks due to continued inability to function at home.  She has been sleeping in a recliner and using a bedside commode.  She has Meals on Wheels delivered for her.  She also has groceries delivered to her door.  She has friends that help put away her groceries.  She lives on her first floor apartment.  She states that she was trying to use the bedside commode today.  She stood up from her recliner.  She felt pain in her left hip and try to sit back down on the recliner.  She states that she was too close to the front edge of the recliner when she sat down.  She ended up falling down on the floor on her buttocks.  She urinated on the floor.  She had a call 911.  She was brought to the ER.   Patient has been on Ozempic for several months now.  She is lost about 30 pounds.  She states that her orthopedic surgeon would offer her surgery if she got down to around 280 pounds.   On arrival to the ER, temp 98.4 heart rate 75 blood pressure 147/74 satting 100% on room air.   White count 8.2, hemoglobin 13.3, platelets of 361   Total CK of 86   Sodium 139, potassium 3.6, chloride of 104, bicarb 25, BUN of 13, creatinine 0.8, glucose of 85   Total protein 7.6, albumin 4.0 AST 21, ALT of 16, alk phos of 76   UA showed specific gravity 1.027.  Negative nitrate.  Trace leukocyte esterase.  No bacteria.   Chest x-ray showed no acute cardiopulmonary disease.   CT head/cervical spine negative for acute intracranial abnormality and acute cervical fracture.   Lumbar x-ray showed some degenerative changes at the L4-5 S5 L1 level.   Bilateral hip  x-ray showed severe arthritic changes in bilateral hips.   CT abdomen pelvis demonstrated continued bilateral arthritic hip changes.   [facility] hospitalist consulted.     Becky Perry  ZOX:[id number] DOB: September 02, 1955 DOA: 06/19/2022 PCP: Norval Morton, MD   Assessment & Plan:   Principal Problem:   Fall at home, initial encounter Active Problems:   Physical debility   Diabetes mellitus (HCC)   Bilateral primary osteoarthritis of hip   Obesity, Class III, BMI 40-49.9 (morbid obesity) (HCC)   History of opioid abuse (HCC) - in remission.   Depression  Assessment and Plan: Fall: at home. No fractures seen. Does not require hospital admission. Continue w/ hospitalist daily consult    Physical debility: PT consulted. OT recs SNF. Will likely need SNF placement    Patient does not require hospital admission.   Morbid obesity: BMI 45.1. Complicates overall care & prognosis    Bilateral primary osteoarthritis of hip: continue on home dose of gabapentin. Tylenol prn    DM2: likely poorly controlled Continue on home dose of metformin   Depression: severity unknown. Continue on home dose of wellbutrin   Hx of opioid abuse: in remission.       DVT prophylaxis: lovenox  Code Status: full  Family Communication:  Disposition  Plan: likely d/c to SNF. D/c as per ER physician as pt is not admitted & hospitalist service is just a consultant   Level of care:  Consultants:  Hospitalist   Procedures:   Antimicrobials:    Subjective: Pt c/o malaise   Objective: Vitals:   06/20/22 0400 06/20/22 0545 06/20/22 0619 06/20/22 0742  BP: (!) 135/91  (!) 142/90 (!) 142/90  Pulse: 85 85 84 90  Resp: 18 (!) 21 20 20   Temp:  98 F (36.7 C)  98.3 F (36.8 C)  TempSrc:    Oral  SpO2: 96% 96% 97% 96%  Weight:      Height:       No intake or output data in the 24 hours ending 06/20/22 0835 Filed Weights   06/19/22 1824  Weight: 127 kg    Examination:  General  exam: Appears calm and comfortable. Morbidly obese Respiratory system: Clear to auscultation. Respiratory effort normal. Cardiovascular system: S1 & S2 +. No rubs, gallops or clicks.  Gastrointestinal system: Abdomen is nondistended, soft and nontender. Normal bowel sounds heard. Central nervous system: Alert and oriented.  Psychiatry: Judgement and insight appear normal. Flat mood and affect     Data Reviewed: I have personally reviewed following labs and imaging studies  CBC: Recent Labs  Lab 06/19/22 2124  WBC 8.2  NEUTROABS 5.2  HGB 13.3  HCT 42.3  MCV 93.0  PLT 361   Basic Metabolic Panel: Recent Labs  Lab 06/19/22 2124  NA 139  K 3.6  CL 104  CO2 25  GLUCOSE 85  BUN 13  CREATININE 0.81  CALCIUM 9.2   GFR: Estimated Creatinine Clearance: 90.7 mL/min (by C-G formula based on SCr of 0.81 mg/dL). Liver Function Tests: Recent Labs  Lab 06/19/22 2124  AST 21  ALT 16  ALKPHOS 76  BILITOT 0.7  PROT 7.6  ALBUMIN 4.0   No results for input(s): "LIPASE", "AMYLASE" in the last 168 hours. No results for input(s): "AMMONIA" in the last 168 hours. Coagulation Profile: No results for input(s): "INR", "PROTIME" in the last 168 hours. Cardiac Enzymes: Recent Labs  Lab 06/19/22 2124  CKTOTAL 86   BNP (last 3 results) No results for input(s): "PROBNP" in the last 8760 hours. HbA1C: No results for input(s): "HGBA1C" in the last 72 hours. CBG: Recent Labs  Lab 06/19/22 2126  GLUCAP 82   Lipid Profile: No results for input(s): "CHOL", "HDL", "LDLCALC", "TRIG", "CHOLHDL", "LDLDIRECT" in the last 72 hours. Thyroid Function Tests: No results for input(s): "TSH", "T4TOTAL", "FREET4", "T3FREE", "THYROIDAB" in the last 72 hours. Anemia Panel: No results for input(s): "VITAMINB12", "FOLATE", "FERRITIN", "TIBC", "IRON", "RETICCTPCT" in the last 72 hours. Sepsis Labs: No results for input(s): "PROCALCITON", "LATICACIDVEN" in the last 168 hours.  No results found for  this or any previous visit (from the past 240 hour(s)).       Radiology Studies: CT Lumbar Spine Wo Contrast  Result Date: 06/20/2022 CLINICAL DATA:  Fall, back and hip pain EXAM: CT LUMBAR SPINE WITHOUT CONTRAST TECHNIQUE: Multidetector CT imaging of the lumbar spine was performed without intravenous contrast administration. Multiplanar CT image reconstructions were also generated. RADIATION DOSE REDUCTION: This exam was performed according to the departmental dose-optimization program which includes automated exposure control, adjustment of the mA and/or kV according to patient size and/or use of iterative reconstruction technique. COMPARISON:  Plain films 06/19/2022 FINDINGS: Segmentation: 5 lumbar type vertebrae. Alignment: Normal. Vertebrae: No acute fracture or focal pathologic process. Paraspinal and other soft  tissues: Negative. Disc levels: Diffuse degenerative disc disease with disc space narrowing, spurring, and vacuum disc. This is most pronounced at L4-5 and L5-S1. Mild broad-based disc old at L2-3, L3-4, and L4-5 along with facet disease causes mild central spinal stenosis. Probable chronic calcified central disc herniation at L5-S1. IMPRESSION: No acute bony abnormality. Diffuse degenerative disc disease and facet disease. Multilevel central spinal stenosis due to disc bulge and facet disease. Suspect chronic calcified central disc herniation at L5-S1. Electronically Signed   By: Charlett Nose M.D.   On: 06/20/2022 06:28   CT PELVIS WO CONTRAST  Result Date: 06/20/2022 CLINICAL DATA:  Fall and left hip pain. EXAM: CT PELVIS WITHOUT CONTRAST TECHNIQUE: Multidetector CT imaging of the pelvis was performed following the standard protocol without intravenous contrast. RADIATION DOSE REDUCTION: This exam was performed according to the departmental dose-optimization program which includes automated exposure control, adjustment of the mA and/or kV according to patient size and/or use of iterative  reconstruction technique. COMPARISON:  Left hip radiograph dated 06/19/2022. FINDINGS: Urinary Tract: The visualized ureters and urinary bladder appear unremarkable. Bowel: Moderate stool throughout the colon. There is sigmoid diverticulosis without active inflammatory changes. The appendix is normal. Vascular/Lymphatic: Mild aortoiliac atherosclerotic disease. The IVC is unremarkable. No pelvic adenopathy. Reproductive:  Hysterectomy.  Bilateral tubal ligation clips. Other:  None Musculoskeletal: Osteopenia with degenerative changes of the visualized lower lumbar spine. There is severe bilateral hip arthritic changes with bone on bone contact. No acute osseous pathology. IMPRESSION: 1. No acute/traumatic pelvic pathology. 2. Sigmoid diverticulosis. 3. Severe bilateral hip arthritic changes with bone on bone contact. 4.  Aortic Atherosclerosis (ICD10-I70.0). Electronically Signed   By: Elgie Collard M.D.   On: 06/20/2022 00:43   DG HIPS BILAT WITH PELVIS MIN 5 VIEWS  Result Date: 06/19/2022 CLINICAL DATA:  Fall. EXAM: DG HIP (WITH OR WITHOUT PELVIS) 5+V BILAT COMPARISON:  Pelvic radiograph dated 05/26/2020. FINDINGS: Evaluation is limited due to osteopenia and body habitus. There is no acute fracture or dislocation. The bones are osteopenic. Severe bilateral hip arthritic changes with bone on bone contact. There is degenerative changes of the visualized lower lumbar spine. Bilateral tubal ligation clips. The soft tissues are unremarkable. IMPRESSION: 1. No acute fracture or dislocation. 2. Severe bilateral hip arthritic changes. Electronically Signed   By: Elgie Collard M.D.   On: 06/19/2022 23:30   DG Lumbar Spine 2-3 Views  Result Date: 06/19/2022 CLINICAL DATA:  Fall unable to walk EXAM: LUMBAR SPINE - 2-3 VIEW COMPARISON:  None Available. FINDINGS: Five non rib-bearing lumbar type vertebra. Lateral views are limited by cross-table technique and habitus. Vertebral body heights are grossly  maintained. Moderate disc space narrowing and degenerative change at L4-L5 and L5-S1 with mild disc space narrowing and degenerative change at L2-L3 and L3-L4. IMPRESSION: Limited study due to habitus and technique. Degenerative changes, most advanced at L4-L5 and L5-S1. Electronically Signed   By: Jasmine Pang M.D.   On: 06/19/2022 23:28   CT Head Wo Contrast  Result Date: 06/19/2022 CLINICAL DATA:  Neck trauma (Age >= 65y); Head trauma, minor (Age >= 65y) fall. EXAM: CT HEAD WITHOUT CONTRAST CT CERVICAL SPINE WITHOUT CONTRAST TECHNIQUE: Multidetector CT imaging of the head and cervical spine was performed following the standard protocol without intravenous contrast. Multiplanar CT image reconstructions of the cervical spine were also generated. RADIATION DOSE REDUCTION: This exam was performed according to the departmental dose-optimization program which includes automated exposure control, adjustment of the mA and/or kV according to patient  size and/or use of iterative reconstruction technique. COMPARISON:  None Available. FINDINGS: CT HEAD FINDINGS Brain: No acute intracranial hemorrhage. Gray-white differentiation is preserved. No hydrocephalus or extra-axial collection. No mass effect or midline shift. Vascular: No hyperdense vessel or unexpected calcification. Skull: No calvarial fracture or suspicious bone lesion. Skull base is unremarkable. Sinuses/Orbits: Unremarkable. Other: None. CT CERVICAL SPINE FINDINGS Alignment: Normal. Skull base and vertebrae: No acute fracture. Normal craniocervical junction. No suspicious bone lesions. Soft tissues and spinal canal: No prevertebral fluid or swelling. No visible canal hematoma. Disc levels: Mild cervical spondylosis without high-grade spinal canal stenosis. Upper chest: Unremarkable. Other: None. IMPRESSION: 1. No acute intracranial abnormality. 2. No acute cervical spine fracture or traumatic malalignment. Electronically Signed   By: Orvan Falconer M.D.    On: 06/19/2022 20:37   CT Cervical Spine Wo Contrast  Result Date: 06/19/2022 CLINICAL DATA:  Neck trauma (Age >= 65y); Head trauma, minor (Age >= 65y) fall. EXAM: CT HEAD WITHOUT CONTRAST CT CERVICAL SPINE WITHOUT CONTRAST TECHNIQUE: Multidetector CT imaging of the head and cervical spine was performed following the standard protocol without intravenous contrast. Multiplanar CT image reconstructions of the cervical spine were also generated. RADIATION DOSE REDUCTION: This exam was performed according to the departmental dose-optimization program which includes automated exposure control, adjustment of the mA and/or kV according to patient size and/or use of iterative reconstruction technique. COMPARISON:  None Available. FINDINGS: CT HEAD FINDINGS Brain: No acute intracranial hemorrhage. Gray-white differentiation is preserved. No hydrocephalus or extra-axial collection. No mass effect or midline shift. Vascular: No hyperdense vessel or unexpected calcification. Skull: No calvarial fracture or suspicious bone lesion. Skull base is unremarkable. Sinuses/Orbits: Unremarkable. Other: None. CT CERVICAL SPINE FINDINGS Alignment: Normal. Skull base and vertebrae: No acute fracture. Normal craniocervical junction. No suspicious bone lesions. Soft tissues and spinal canal: No prevertebral fluid or swelling. No visible canal hematoma. Disc levels: Mild cervical spondylosis without high-grade spinal canal stenosis. Upper chest: Unremarkable. Other: None. IMPRESSION: 1. No acute intracranial abnormality. 2. No acute cervical spine fracture or traumatic malalignment. Electronically Signed   By: Orvan Falconer M.D.   On: 06/19/2022 20:37   DG Chest Port 1 View  Result Date: 06/19/2022 CLINICAL DATA:  Weakness EXAM: PORTABLE CHEST 1 VIEW COMPARISON:  12/27/2014 FINDINGS: The heart size and mediastinal contours are within normal limits. Both lungs are clear. The visualized skeletal structures are unremarkable.  IMPRESSION: No active disease. Electronically Signed   By: Helyn Numbers M.D.   On: 06/19/2022 19:30        Scheduled Meds:  acetaminophen  1,000 mg Oral Q6H   albuterol  2.5 mg Nebulization Once   amLODipine  5 mg Oral Daily   buPROPion  200 mg Oral q morning   famotidine  20 mg Oral BID   gabapentin  300 mg Oral BID   ibuprofen  400 mg Oral QID   metFORMIN  500 mg Oral Q breakfast   Continuous Infusions:   LOS: 0 days    Time spent: 35 mins     Charise Killian, MD [facility] Pager [phone]  If 7PM-7AM, please contact night-coverage [email or web address] 06/20/2022, 8:35 AM

## 2022-06-20 NOTE — Assessment & Plan Note (Addendum)
No fractures seen.  Patient does not require hospital admission.

## 2022-06-20 NOTE — Consult Note (Signed)
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  Patient has been relegated to sleeping in a recliner for the last 7 weeks due to continued inability to function at home.  She has been sleeping in a recliner and using a bedside commode.  She has Meals on Wheels delivered for her.  She also has groceries delivered to her door.  She has friends that help put away her groceries.  She lives on her first floor apartment.  She states that she was trying to use the bedside commode today.  She stood up from her recliner.  She felt pain in her left hip and try to sit back down on the recliner.  She states that she was too close to the front edge of the recliner when she sat down.  She ended up falling down on the floor on her buttocks.  She urinated on the floor.  She had a call 911.  She was brought to the ER.  Patient has been on Ozempic for several months now.  She is lost about 30 pounds.  She states that her orthopedic surgeon would offer her surgery if she got down to around 280 pounds.  On arrival to the ER, temp 98.4 heart rate 75 blood pressure 147/74 satting 100% on room air.  White count 8.2, hemoglobin 13.3, platelets of 361  Total CK of 86  Sodium 139, potassium 3.6, chloride of 104, bicarb 25, BUN of 13, creatinine 0.8, glucose of 85  Total protein 7.6, albumin 4.0 AST 21, ALT of 16, alk phos of 76  UA showed specific gravity 1.027.  Negative nitrate.  Trace  leukocyte esterase.  No bacteria.  Chest x-ray showed no acute cardiopulmonary disease.  CT head/cervical spine negative for acute intracranial abnormality and acute cervical fracture.  Lumbar x-ray showed some degenerative changes at the L4-5 S5 L1 level.  Bilateral hip x-ray showed severe arthritic changes in bilateral hips.  CT abdomen pelvis demonstrated continued bilateral arthritic hip changes.  [facility] consulted.   ED Course: Xrays negative for acute fracture. CBC, CMP all WNL.  Review of Systems:  Review of Systems  Constitutional: Negative.   HENT: Negative.    Eyes: Negative.   Respiratory: Negative.    Cardiovascular: Negative.   Gastrointestinal: Negative.   Genitourinary: Negative.  Negative for dysuria and urgency.  Musculoskeletal:        Chronic bilateral hip pain  Neurological: Negative.   Endo/Heme/Allergies: Negative.   Psychiatric/Behavioral:         Prior hx of opiate abuse about 20-30 years ago. Clean for 20-30 years  All other systems reviewed and are negative.   Past Medical History:  Diagnosis Date   Anxiety    Back pain    COPD (chronic obstructive pulmonary disease) (HCC)    Depression    Diabetes mellitus type II, controlled (HCC)    Drug use    Essential hypertension    Joint pain  Morbid obesity (HCC)    Tobacco abuse    a. 11/2013 smoking 2ppd    Past Surgical History:  Procedure Laterality Date   CHOLECYSTECTOMY     VAGINAL HYSTERECTOMY       reports that she has quit smoking. Her smoking use included cigarettes. She has a 60.00 pack-year smoking history. She has quit using smokeless tobacco. She reports that she does not currently use alcohol. She reports that she does not currently use drugs.  Allergies  Allergen Reactions   Codeine Sulfate    Erythromycin     Family History  Problem Relation Age of Onset   Thyroid disease Mother    Hyperlipidemia Mother    COPD Mother        alive in her 81's   Cancer  Father        died in his 69's - heroine addict.    Prior to Admission medications   Medication Sig Start Date End Date Taking? Authorizing Provider  albuterol (PROVENTIL HFA;VENTOLIN HFA) 108 (90 BASE) MCG/ACT inhaler Inhale 4-6 puffs by mouth every 4 hours as needed for wheezing, cough, and/or shortness of breath 12/27/14  Yes Loleta Rose, MD  buPROPion [facility] SR) 200 MG 12 hr tablet Q AM Patient taking differently: Take 200 mg by mouth every morning. Q AM 10/28/20  Yes Beasley, Caren D, MD  gabapentin (NEURONTIN) 300 MG capsule Take 300 mg by mouth daily at 12 noon.   Yes [provider]  ipratropium (ATROVENT HFA) 17 MCG/ACT inhaler Inhale 2 puffs into the lungs every 6 (six) hours as needed for wheezing.   Yes [provider]  MAPAP ARTHRITIS PAIN 650 MG CR tablet Take 650 mg by mouth every 8 (eight) hours as needed. 03/31/22  Yes [provider]  OZEMPIC, 2 MG/DOSE, 8 MG/3ML SOPN Inject 2 mg into the skin once a week. 06/16/22  Yes [provider]  APO-VARENICLINE 1 MG tablet Take 1 mg by mouth 2 (two) times daily. Patient not taking: Reported on 06/19/2022 05/27/21   [provider]  Biotin 5 MG CAPS Take 5,000 mg by mouth daily at 12 noon. Patient not taking: Reported on 06/19/2022    [provider]  Fluticasone-Salmeterol (ADVAIR) 250-50 MCG/DOSE AEPB Inhale 1 puff into the lungs 2 (two) times daily. Patient not taking: Reported on 06/19/2022    [provider]  Glucosamine-Chondroitin (GLUCOSAMINE CHONDR COMPLEX PO) Take by mouth. Patient not taking: Reported on 06/19/2022    [provider]  ipratropium-albuterol (DUONEB) 0.5-2.5 (3) MG/3ML SOLN Take 3 mLs by nebulization. Patient not taking: Reported on 06/19/2022    [provider]  lisinopril (PRINIVIL,ZESTRIL) 10 MG tablet Take 10 mg by mouth daily. Patient not taking: Reported on 06/19/2022    [provider]  loratadine (CLARITIN) 10 MG  tablet Take 10 mg by mouth daily. Patient not taking: Reported on 06/19/2022    [provider]  meloxicam (MOBIC) 15 MG tablet Take 15 mg by mouth daily. Patient not taking: Reported on 06/19/2022    [provider]  metFORMIN (GLUCOPHAGE) 500 MG tablet Dec dose to 250mg  BID Patient not taking: Reported on 06/19/2022 08/10/20   Thomasene Lot, DO  Multiple Vitamins-Minerals (CENTRUM SILVER PO) Take by mouth. Patient not taking: Reported on 06/19/2022    [provider]  pravastatin (PRAVACHOL) 40 MG tablet Take 40 mg by mouth daily. Patient not taking: Reported on 06/19/2022    [provider]  Vitamin D, Ergocalciferol, (DRISDOL) 1.25  MG (50000 UNIT) CAPS capsule Take 1 capsule (50,000 Units total) by mouth every 7 (seven) days. Patient not taking: Reported on 06/19/2022 07/11/21   Quillian Quince D, MD    Physical Exam: Vitals:   06/20/22 0200 06/20/22 0230 06/20/22 0300 06/20/22 0400  BP: (!) 154/75 (!) 149/79 (!) 159/87 (!) 135/91  Pulse: 84 95 92 85  Resp: 19 19 16 18   Temp:      TempSrc:      SpO2: 97% 95% 93% 96%  Weight:      Height:        Physical Exam Vitals and nursing note reviewed.  Constitutional:      General: She is not in acute distress.    Appearance: She is obese. She is not ill-appearing, toxic-appearing or diaphoretic.  HENT:     Head: Normocephalic and atraumatic.     Nose: Nose normal.  Eyes:     General: No scleral icterus. Cardiovascular:     Rate and Rhythm: Normal rate and regular rhythm.     Pulses: Normal pulses.  Pulmonary:     Effort: Pulmonary effort is normal.     Breath sounds: Normal breath sounds.  Abdominal:     General: Bowel sounds are normal. There is no distension.  Skin:    General: Skin is warm and dry.     Capillary Refill: Capillary refill takes less than 2 seconds.  Neurological:     General: No focal deficit present.     Mental Status: She is alert and oriented to person, place, and time.       Labs on Admission: I have personally reviewed following labs and imaging studies  CBC: Recent Labs  Lab 06/19/22 2124  WBC 8.2  NEUTROABS 5.2  HGB 13.3  HCT 42.3  MCV 93.0  PLT 361   Basic Metabolic Panel: Recent Labs  Lab 06/19/22 2124  NA 139  K 3.6  CL 104  CO2 25  GLUCOSE 85  BUN 13  CREATININE 0.81  CALCIUM 9.2   GFR: Estimated Creatinine Clearance: 90.7 mL/min (by C-G formula based on SCr of 0.81 mg/dL). Liver Function Tests: Recent Labs  Lab 06/19/22 2124  AST 21  ALT 16  ALKPHOS 76  BILITOT 0.7  PROT 7.6  ALBUMIN 4.0   Cardiac Enzymes: Recent Labs  Lab 06/19/22 2124  CKTOTAL 86   CBG: Recent Labs  Lab 06/19/22 2126  GLUCAP 82   Urine analysis:    Component Value Date/Time   COLORURINE YELLOW (A) 06/19/2022 2123   APPEARANCEUR CLEAR (A) 06/19/2022 2123   APPEARANCEUR Cloudy 12/28/2013 1952   LABSPEC 1.027 06/19/2022 2123   LABSPEC 1.028 12/28/2013 1952   PHURINE 5.0 06/19/2022 2123   GLUCOSEU NEGATIVE 06/19/2022 2123   GLUCOSEU >=500 12/28/2013 1952   HGBUR NEGATIVE 06/19/2022 2123   BILIRUBINUR NEGATIVE 06/19/2022 2123   BILIRUBINUR Negative 12/28/2013 1952   KETONESUR NEGATIVE 06/19/2022 2123   PROTEINUR NEGATIVE 06/19/2022 2123   NITRITE NEGATIVE 06/19/2022 2123   LEUKOCYTESUR TRACE (A) 06/19/2022 2123   LEUKOCYTESUR Negative 12/28/2013 1952    Radiological Exams on Admission: I have personally reviewed images CT PELVIS WO CONTRAST  Result Date: 06/20/2022 CLINICAL DATA:  Fall and left hip pain. EXAM: CT PELVIS WITHOUT CONTRAST TECHNIQUE: Multidetector CT imaging of the pelvis was performed following the standard protocol without intravenous contrast. RADIATION DOSE REDUCTION: This exam was performed according to the departmental dose-optimization program which includes automated exposure control, adjustment of the mA and/or kV according  to patient size and/or use of iterative reconstruction technique. COMPARISON:  Left hip  radiograph dated 06/19/2022. FINDINGS: Urinary Tract: The visualized ureters and urinary bladder appear unremarkable. Bowel: Moderate stool throughout the colon. There is sigmoid diverticulosis without active inflammatory changes. The appendix is normal. Vascular/Lymphatic: Mild aortoiliac atherosclerotic disease. The IVC is unremarkable. No pelvic adenopathy. Reproductive:  Hysterectomy.  Bilateral tubal ligation clips. Other:  None Musculoskeletal: Osteopenia with degenerative changes of the visualized lower lumbar spine. There is severe bilateral hip arthritic changes with bone on bone contact. No acute osseous pathology. IMPRESSION: 1. No acute/traumatic pelvic pathology. 2. Sigmoid diverticulosis. 3. Severe bilateral hip arthritic changes with bone on bone contact. 4.  Aortic Atherosclerosis (ICD10-I70.0). Electronically Signed   By: Elgie Collard M.D.   On: 06/20/2022 00:43   DG HIPS BILAT WITH PELVIS MIN 5 VIEWS  Result Date: 06/19/2022 CLINICAL DATA:  Fall. EXAM: DG HIP (WITH OR WITHOUT PELVIS) 5+V BILAT COMPARISON:  Pelvic radiograph dated 05/26/2020. FINDINGS: Evaluation is limited due to osteopenia and body habitus. There is no acute fracture or dislocation. The bones are osteopenic. Severe bilateral hip arthritic changes with bone on bone contact. There is degenerative changes of the visualized lower lumbar spine. Bilateral tubal ligation clips. The soft tissues are unremarkable. IMPRESSION: 1. No acute fracture or dislocation. 2. Severe bilateral hip arthritic changes. Electronically Signed   By: Elgie Collard M.D.   On: 06/19/2022 23:30   DG Lumbar Spine 2-3 Views  Result Date: 06/19/2022 CLINICAL DATA:  Fall unable to walk EXAM: LUMBAR SPINE - 2-3 VIEW COMPARISON:  None Available. FINDINGS: Five non rib-bearing lumbar type vertebra. Lateral views are limited by cross-table technique and habitus. Vertebral body heights are grossly maintained. Moderate disc space narrowing and degenerative  change at L4-L5 and L5-S1 with mild disc space narrowing and degenerative change at L2-L3 and L3-L4. IMPRESSION: Limited study due to habitus and technique. Degenerative changes, most advanced at L4-L5 and L5-S1. Electronically Signed   By: Jasmine Pang M.D.   On: 06/19/2022 23:28   CT Head Wo Contrast  Result Date: 06/19/2022 CLINICAL DATA:  Neck trauma (Age >= 65y); Head trauma, minor (Age >= 65y) fall. EXAM: CT HEAD WITHOUT CONTRAST CT CERVICAL SPINE WITHOUT CONTRAST TECHNIQUE: Multidetector CT imaging of the head and cervical spine was performed following the standard protocol without intravenous contrast. Multiplanar CT image reconstructions of the cervical spine were also generated. RADIATION DOSE REDUCTION: This exam was performed according to the departmental dose-optimization program which includes automated exposure control, adjustment of the mA and/or kV according to patient size and/or use of iterative reconstruction technique. COMPARISON:  None Available. FINDINGS: CT HEAD FINDINGS Brain: No acute intracranial hemorrhage. Gray-white differentiation is preserved. No hydrocephalus or extra-axial collection. No mass effect or midline shift. Vascular: No hyperdense vessel or unexpected calcification. Skull: No calvarial fracture or suspicious bone lesion. Skull base is unremarkable. Sinuses/Orbits: Unremarkable. Other: None. CT CERVICAL SPINE FINDINGS Alignment: Normal. Skull base and vertebrae: No acute fracture. Normal craniocervical junction. No suspicious bone lesions. Soft tissues and spinal canal: No prevertebral fluid or swelling. No visible canal hematoma. Disc levels: Mild cervical spondylosis without high-grade spinal canal stenosis. Upper chest: Unremarkable. Other: None. IMPRESSION: 1. No acute intracranial abnormality. 2. No acute cervical spine fracture or traumatic malalignment. Electronically Signed   By: Orvan Falconer M.D.   On: 06/19/2022 20:37   CT Cervical Spine Wo  Contrast  Result Date: 06/19/2022 CLINICAL DATA:  Neck trauma (Age >= 65y); Head trauma, minor (  Age >= 65y) fall. EXAM: CT HEAD WITHOUT CONTRAST CT CERVICAL SPINE WITHOUT CONTRAST TECHNIQUE: Multidetector CT imaging of the head and cervical spine was performed following the standard protocol without intravenous contrast. Multiplanar CT image reconstructions of the cervical spine were also generated. RADIATION DOSE REDUCTION: This exam was performed according to the departmental dose-optimization program which includes automated exposure control, adjustment of the mA and/or kV according to patient size and/or use of iterative reconstruction technique. COMPARISON:  None Available. FINDINGS: CT HEAD FINDINGS Brain: No acute intracranial hemorrhage. Gray-white differentiation is preserved. No hydrocephalus or extra-axial collection. No mass effect or midline shift. Vascular: No hyperdense vessel or unexpected calcification. Skull: No calvarial fracture or suspicious bone lesion. Skull base is unremarkable. Sinuses/Orbits: Unremarkable. Other: None. CT CERVICAL SPINE FINDINGS Alignment: Normal. Skull base and vertebrae: No acute fracture. Normal craniocervical junction. No suspicious bone lesions. Soft tissues and spinal canal: No prevertebral fluid or swelling. No visible canal hematoma. Disc levels: Mild cervical spondylosis without high-grade spinal canal stenosis. Upper chest: Unremarkable. Other: None. IMPRESSION: 1. No acute intracranial abnormality. 2. No acute cervical spine fracture or traumatic malalignment. Electronically Signed   By: Orvan Falconer M.D.   On: 06/19/2022 20:37   DG Chest Port 1 View  Result Date: 06/19/2022 CLINICAL DATA:  Weakness EXAM: PORTABLE CHEST 1 VIEW COMPARISON:  12/27/2014 FINDINGS: The heart size and mediastinal contours are within normal limits. Both lungs are clear. The visualized skeletal structures are unremarkable. IMPRESSION: No active disease. Electronically Signed    By: Helyn Numbers M.D.   On: 06/19/2022 19:30    EKG: My personal interpretation of EKG shows: NSR    Assessment/Plan Principal Problem:   Fall at home, initial encounter Active Problems:   Physical debility   Diabetes mellitus (HCC)   Bilateral primary osteoarthritis of hip   Obesity, Class III, BMI 40-49.9 (morbid obesity) (HCC)   History of opioid abuse (HCC) - in remission.   Depression    Assessment and Plan: * Fall at home, initial encounter No fractures seen.  Patient does not require hospital admission.  Physical debility Pt has had a slow and steady decline in her ambulatory status for the last several weeks to months. Pt having difficulty walking due to bilateral hip arthritis. Pt will need SNF placement.  Will consult PT/OT and case management to begin search. Pt would like to stay in/near [place].  Patient does not require hospital admission.  Obesity, Class III, BMI 40-49.9 (morbid obesity) (HCC) Pt remains on Ozempic. Pt recently had 3 month supply of her medication delivered to her. Pt can use her own supply of Ozempic at SNF.  Bilateral primary osteoarthritis of hip Pt states her surgeon wants her to be at 280 lbs before THA is offered.  Start scheduled Tylenol.  Continue with gabapentin.  300 mg twice daily.  Diabetes mellitus (HCC) Stable. On metformin.  Depression Continue wellbutrin  History of opioid abuse (HCC) - in remission. Pt declines any form of narcotic opiate. She does not want to get addicted again.   Family Communication: no family at bedside  Disposition Plan: SNF placement  Consults called: PT/OT/TOC  Admission status:  pt does not require hospital admission ,  TOC to begin SNF search   Carollee Herter, DO [facility] 06/20/2022, 5:47 AM

## 2022-06-20 NOTE — Evaluation (Signed)
Occupational Therapy Evaluation Patient Details Name: Becky Perry MRN: [id number] DOB: Aug 25, 1955 Today's Date: 06/20/2022   History of Present Illness 67 y.o. female  with history of T2DM presenting to the emergency department for evaluation after a fall.  Patient reports that she has bilateral hip arthritis and is currently attempting weight loss before surgery.  However, she reports that over the past several months she has had significant progressive weakness.  She reports that she spends all day in her recliner, has a portable toilet, but is struggling to take even a few feet to that.  Today, she stood up to try to go to the bathroom, tripped and fell onto her back and hips.  She had to crawl to trying to get help.  She reports significant hip pain.  She is concerned about her ability to care for self at home   Clinical Impression   Patient presenting with decreased Ind in self care,balance, functional mobility/transfers, endurance, and safety awareness. Patient reports living at home alone at baseline. She has been unable to ambulate for ~ 2 months. Pt recently getting HHOT, PT, and PCA but those services have signed off. Pt is confined to lift chair and washes with use of wipes. She transfers self to [facility] <> lift chair only. Lift chair is at front door where she is able to open to accept meals on wheels but unable to access any other part of her home including a refrigerator. Pt needing max A for bed mobility to get to edge of stretcher with increased pain and pt calling out.Patient will benefit from acute OT to increase overall independence in the areas of ADLs, functional mobility, and safety awareness in order to safely discharge.      Recommendations for follow up therapy are one component of a multi-disciplinary discharge planning process, led by the attending physician.  Recommendations may be updated based on patient status, additional functional criteria and insurance authorization.    Assistance Recommended at Discharge Frequent or constant Supervision/Assistance  Patient can return home with the following A lot of help with walking and/or transfers;A lot of help with bathing/dressing/bathroom;Assistance with cooking/housework;Assist for transportation    Functional Status Assessment  Patient has had a recent decline in their functional status and demonstrates the ability to make significant improvements in function in a reasonable and predictable amount of time.  Equipment Recommendations  None recommended by OT       Precautions / Restrictions Precautions Precautions: Fall      Mobility Bed Mobility Overal bed mobility: Needs Assistance Bed Mobility: Supine to Sit, Sit to Supine     Supine to sit: Max assist Sit to supine: Max assist        Transfers                   General transfer comment: unable to attempt secondary to increased pain      Balance Overall balance assessment: Needs assistance Sitting-balance support: Bilateral upper extremity supported Sitting balance-Leahy Scale: Fair                                     ADL either performed or assessed with clinical judgement   ADL Overall ADL's : Needs assistance/impaired  General ADL Comments: unable to attempt stand pivot secondary to increased pain with mobility. Pt is max- total A for bed level.     Vision Patient Visual Report: No change from baseline              Pertinent Vitals/Pain Pain Assessment Pain Assessment: Faces Faces Pain Scale: Hurts whole lot Pain Location: B LEs with bed mobility Pain Descriptors / Indicators: Discomfort, Grimacing, Guarding, Sharp Pain Intervention(s): Limited activity within patient's tolerance, Repositioned        Extremity/Trunk Assessment Upper Extremity Assessment Upper Extremity Assessment: Generalized weakness   Lower Extremity Assessment Lower  Extremity Assessment: Generalized weakness          Cognition Arousal/Alertness: Awake/alert Behavior During Therapy: WFL for tasks assessed/performed Overall Cognitive Status: Within Functional Limits for tasks assessed                                                  Home Living Family/patient expects to be discharged to:: Private residence Living Arrangements: Alone Available Help at Discharge: Friend(s);Available PRN/intermittently Type of Home: Apartment Home Access: Level entry     Home Layout: One level     Bathroom Shower/Tub: Other (comment) (Pt uses wipes while seated in lift chair)         Home Equipment: Wheelchair - Forensic psychologist (2 wheels);BSC/3in1   Additional Comments: lift chair      Prior Functioning/Environment Prior Level of Function : Needs assist             Mobility Comments: Pt has been confined to lift chair and has not ambulates in ~ 2 months. Stand pivot transfers from elevated surfaces. ADLs Comments: Pt reports for the last 2 months she has been sleeping/living in her lift chair. She has only been transferring from lift chair to elevated BSC. 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 Recently stopped HH therapies and PCA secondary to out of visits.        OT Problem List: Decreased strength;Pain;Decreased activity tolerance;Decreased safety awareness;Impaired balance (sitting and/or standing);Decreased knowledge of use of DME or AE;Decreased knowledge of precautions      OT Treatment/Interventions: Self-care/ADL training;Therapeutic exercise;Therapeutic activities;DME and/or AE instruction;Patient/family education;Balance training;Energy conservation    OT Goals(Current goals can be found in the care plan section) Acute Rehab OT Goals Patient Stated Goal: to get stronger OT Goal Formulation: With patient Time For Goal Achievement: 07/04/22 Potential to Achieve Goals: Fair ADL Goals Pt  Will Perform Grooming: with supervision;sitting Pt Will Perform Lower Body Dressing: with min assist;sit to/from stand Pt Will Transfer to Toilet: with min assist;bedside commode;stand pivot transfer Pt Will Perform Toileting - Clothing Manipulation and hygiene: with min assist;sit to/from stand  OT Frequency: Min 2X/week       AM-PAC OT "6 Clicks" Daily Activity     Outcome Measure Help from another person eating meals?: None Help from another person taking care of personal grooming?: A Little Help from another person toileting, which includes using toliet, bedpan, or urinal?: Total Help from another person bathing (including washing, rinsing, drying)?: A Lot Help from another person to put on and taking off regular upper body clothing?: A Lot Help from another person to put on and taking off regular lower body clothing?: Total 6 Click Score: 13   End of Session    Activity  Tolerance: Patient limited by pain Patient left: in bed;with call bell/phone within reach  OT Visit Diagnosis: Unsteadiness on feet (R26.81);Repeated falls (R29.6);Muscle weakness (generalized) (M62.81);History of falling (Z91.81);Pain Pain - Right/Left: Left Pain - part of body: Leg                Time: 1000-1029 OT Time Calculation (min): 29 min Charges:  OT General Charges $OT Visit: 1 Visit OT Evaluation $OT Eval Moderate Complexity: 1 Mod OT Treatments $Therapeutic Activity: 8-22 mins  Jackquline Denmark, MS, OTR/L , CBIS ascom [phone]  06/20/22, 1:34 PM

## 2022-06-20 NOTE — Assessment & Plan Note (Addendum)
Pt states her surgeon wants her to be at 280 lbs before THA is offered.  Start scheduled Tylenol.  Continue with gabapentin.  300 mg twice daily.

## 2022-06-20 NOTE — Subjective & Objective (Signed)
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  Patient has been relegated to sleeping in a recliner for the last 7 weeks due to continued inability to function at home.  She has been sleeping in a recliner and using a bedside commode.  She has [employer] Whe[employer] delivered for her.  She also has groceries delivered to her door.  She has friends that help put away her groceries.  She lives on her first floor apartment.  She states that she was trying to use the bedside commode today.  She stood up from her recliner.  She felt pain in her left hip and try to sit back down on the recliner.  She states that she was too close to the front edge of the recliner when she sat down.  She ended up falling down on the floor on her buttocks.  She urinated on the floor.  She had a call 911.  She was brought to the ER.  Patient has been on Ozempic for several months now.  She is lost about 30 pounds.  She states that her orthopedic surgeon would offer her surgery if she got down to around 280 pounds.  On arrival to the ER, temp 98.4 heart rate 75 blood pressure 147/74 satting 100% on room air.  White count 8.2, hemoglobin 13.3, platelets of 361  Total CK of 86  Sodium 139, potassium 3.6, chloride of 104, bicarb 25, BUN of 13, creatinine 0.8, glucose of 85  Total protein 7.6, albumin 4.0 AST 21, ALT of 16, alk phos of 76  UA showed specific gravity 1.027.  Negative nitrate.  Trace leukocyte esterase.  No bacteria.  Chest x-ray showed no acute cardiopulmonary disease.  CT head/cervical spine negative for acute intracranial abnormality and acute cervical fracture.  Lumbar x-ray showed some degenerative changes at the L4-5 S5 L1 level.  Bilateral hip x-ray showed severe arthritic changes in bilateral  hips.  CT abdomen pelvis demonstrated continued bilateral arthritic hip changes.  [facility] hospitalist consulted.

## 2022-06-20 NOTE — ED Notes (Signed)
Offered to place pt in an inpatient hospital bed at this time. Pt refused at this time saying that she thinks it will hurt and she will think about doing it later.

## 2022-06-20 NOTE — Assessment & Plan Note (Addendum)
Pt has had a slow and steady decline in her ambulatory status for the last several weeks to months. Pt having difficulty walking due to bilateral hip arthritis. Pt will need SNF placement.  Will consult PT/OT and case management to begin search. Pt would like to stay in/near [place].  Patient does not require hospital admission.

## 2022-06-20 NOTE — Assessment & Plan Note (Signed)
Pt declines any form of narcotic opiate. She does not want to get addicted again.

## 2022-06-20 NOTE — ED Notes (Signed)
Pt placed on a bedpan.  

## 2022-06-20 NOTE — Assessment & Plan Note (Signed)
Stable. On metformin.
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
## 2022-06-20 NOTE — Assessment & Plan Note (Signed)
Continue wellbutrin. 

## 2022-06-21 ENCOUNTER — Encounter: Payer: Self-pay | Admitting: Internal Medicine

## 2022-06-21 DIAGNOSIS — Y92009 Unspecified place in unspecified non-institutional (private) residence as the place of occurrence of the external cause: Secondary | ICD-10-CM

## 2022-06-21 DIAGNOSIS — M160 Bilateral primary osteoarthritis of hip: Secondary | ICD-10-CM

## 2022-06-21 DIAGNOSIS — W19XXXA Unspecified fall, initial encounter: Secondary | ICD-10-CM | POA: Diagnosis not present

## 2022-06-21 DIAGNOSIS — R5381 Other malaise: Secondary | ICD-10-CM | POA: Diagnosis not present

## 2022-06-21 NOTE — ED Notes (Signed)
Pt endorsing she is wet. Pt cleaned by this RN, Lenise Arena, and [employer]. New chucks pads placed under pt, pt repositioned in bed for comfort. Purewick repositioned, pillows placed in between legs and heels floated. Pt denies further needs at this time. Call light is in reach, [facility].

## 2022-06-21 NOTE — ED Notes (Signed)
Pt saturated with urine. Pt cleaned and repositioned in bed by this RN, Efraim Kaufmann RN, and Art therapist. New gown on pt, new chucks pads under pt. Purewick repositioned. Pt denies further needs. VSS, NAD, call light in reach.

## 2022-06-21 NOTE — Progress Notes (Signed)
Progress Note    Becky Perry  AVW:[id number] DOB: 07-15-1955  DOA: 06/19/2022 PCP: Norval Morton, MD      Brief Narrative:    Medical records reviewed and are as summarized below:  Becky Perry is a 67 y.o. 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 Patient has been relegated to sleeping in a recliner for the last 7 weeks due to continued inability to function at home. She has been sleeping in a recliner and using a bedside commode. She has Meals on Wheels delivered for her. She also has groceries delivered to her door. She has friends that help put away her groceries. She lives on her first floor apartment. She states that she was trying to use the bedside commode on the day of admission. She stood up from her recliner. She felt pain in her left hip and tried to sit back down on the recliner. She states that she was too close to the front edge of the recliner when she sat down. She ended up falling down on the floor on her buttocks. She urinated on the floor.       Assessment/Plan:   Principal Problem:   Fall at home, initial encounter Active Problems:   Physical debility   Diabetes mellitus (HCC)   Bilateral primary osteoarthritis of hip   Obesity, Class III, BMI 40-49.9 (morbid obesity) (HCC)   History of opioid abuse (HCC) - in remission.   Depression    Body mass index is 45.19 kg/m.  (Morbid obesity)   S/p fall at home fall: No fractures seen.  There is no indication for hospital admission at this time.  Hospitalist will continue to follow.    Physical debility: PT and OT recommended discharge to SNF.  Follow-up with TOC to assist with disposition.     Morbid obesity: BMI 45.1. Complicates overall care & prognosis     Bilateral primary  osteoarthritis of hip: She said her surgeon wants her weight to come down to 280 lbs before pounds before she would be considered for THA.  Continue analgesics as needed for pain.      Type II DM: Continue metformin    Depression: severity unknown. Continue on home dose of wellbutrin     Hx of opioid use disorder: in remission.     Diet Order             Diet regular Room service appropriate? Yes; Fluid consistency: Thin  Diet effective now                            Consultants: None  Procedures: None    Medications:    acetaminophen  1,000 mg Oral Q6H   amLODipine  5 mg Oral Daily   buPROPion  200 mg Oral q morning   enoxaparin (LOVENOX) injection  0.5 mg/kg Subcutaneous Q24H   famotidine  20 mg Oral BID   gabapentin  300 mg Oral BID   ibuprofen  400 mg Oral QID   metFORMIN  500 mg Oral Q breakfast   Continuous Infusions:   Anti-infectives (From admission, onward)    None              Family Communication/Anticipated D/C date  and plan/Code Status   DVT prophylaxis: Place and maintain sequential compression device Start: 06/20/22 1435     Code Status: Not on file  Family Communication: None Disposition Plan: Awaiting placement to SNF         Subjective:   Interval events noted.  She complains of pain in bilateral hips.  No other complaints.  Objective:    Vitals:   06/20/22 0937 06/20/22 1000 06/21/22 0231 06/21/22 0817  BP: 123/77 134/66 137/70 128/74  Pulse:  71 80 72  Resp:  17 20 15   Temp:  98 F (36.7 C) 98 F (36.7 C) 98.1 F (36.7 C)  TempSrc:   Oral Oral  SpO2:  100% 96% 95%  Weight:      Height:       No data found.  No intake or output data in the 24 hours ending 06/21/22 0949 Filed Weights   06/19/22 1824  Weight: 127 kg    Exam:  GEN: NAD SKIN: Warm and dry EYES: No pallor or icterus ENT: MMM CV: RRR PULM: CTA B ABD: soft, obese, NT, +BS CNS: AAO x 3, non focal EXT: No edema or  tenderness        Data Reviewed:   I have personally reviewed following labs and imaging studies:  Labs: Labs show the following:   Basic Metabolic Panel: Recent Labs  Lab 06/19/22 2124  NA 139  K 3.6  CL 104  CO2 25  GLUCOSE 85  BUN 13  CREATININE 0.81  CALCIUM 9.2   GFR Estimated Creatinine Clearance: 90.7 mL/min (by C-G formula based on SCr of 0.81 mg/dL). Liver Function Tests: Recent Labs  Lab 06/19/22 2124  AST 21  ALT 16  ALKPHOS 76  BILITOT 0.7  PROT 7.6  ALBUMIN 4.0   No results for input(s): "LIPASE", "AMYLASE" in the last 168 hours. No results for input(s): "AMMONIA" in the last 168 hours. Coagulation profile No results for input(s): "INR", "PROTIME" in the last 168 hours.  CBC: Recent Labs  Lab 06/19/22 2124  WBC 8.2  NEUTROABS 5.2  HGB 13.3  HCT 42.3  MCV 93.0  PLT 361   Cardiac Enzymes: Recent Labs  Lab 06/19/22 2124  CKTOTAL 86   BNP (last 3 results) No results for input(s): "PROBNP" in the last 8760 hours. CBG: Recent Labs  Lab 06/19/22 2126  GLUCAP 82   D-Dimer: No results for input(s): "DDIMER" in the last 72 hours. Hgb A1c: No results for input(s): "HGBA1C" in the last 72 hours. Lipid Profile: No results for input(s): "CHOL", "HDL", "LDLCALC", "TRIG", "CHOLHDL", "LDLDIRECT" in the last 72 hours. Thyroid function studies: No results for input(s): "TSH", "T4TOTAL", "T3FREE", "THYROIDAB" in the last 72 hours.  Invalid input(s): "FREET3" Anemia work up: No results for input(s): "VITAMINB12", "FOLATE", "FERRITIN", "TIBC", "IRON", "RETICCTPCT" in the last 72 hours. Sepsis Labs: Recent Labs  Lab 06/19/22 2124  WBC 8.2    Microbiology No results found for this or any previous visit (from the past 240 hour(s)).  Procedures and diagnostic studies:  CT Lumbar Spine Wo Contrast  Result Date: 06/20/2022 CLINICAL DATA:  Fall, back and hip pain EXAM: CT LUMBAR SPINE WITHOUT CONTRAST TECHNIQUE: Multidetector CT imaging  of the lumbar spine was performed without intravenous contrast administration. Multiplanar CT image reconstructions were also generated. RADIATION DOSE REDUCTION: This exam was performed according to the departmental dose-optimization program which includes automated exposure control, adjustment of the mA and/or kV according to patient size and/or use of iterative  reconstruction technique. COMPARISON:  Plain films 06/19/2022 FINDINGS: Segmentation: 5 lumbar type vertebrae. Alignment: Normal. Vertebrae: No acute fracture or focal pathologic process. Paraspinal and other soft tissues: Negative. Disc levels: Diffuse degenerative disc disease with disc space narrowing, spurring, and vacuum disc. This is most pronounced at L4-5 and L5-S1. Mild broad-based disc old at L2-3, L3-4, and L4-5 along with facet disease causes mild central spinal stenosis. Probable chronic calcified central disc herniation at L5-S1. IMPRESSION: No acute bony abnormality. Diffuse degenerative disc disease and facet disease. Multilevel central spinal stenosis due to disc bulge and facet disease. Suspect chronic calcified central disc herniation at L5-S1. Electronically Signed   By: Charlett Nose M.D.   On: 06/20/2022 06:28   CT PELVIS WO CONTRAST  Result Date: 06/20/2022 CLINICAL DATA:  Fall and left hip pain. EXAM: CT PELVIS WITHOUT CONTRAST TECHNIQUE: Multidetector CT imaging of the pelvis was performed following the standard protocol without intravenous contrast. RADIATION DOSE REDUCTION: This exam was performed according to the departmental dose-optimization program which includes automated exposure control, adjustment of the mA and/or kV according to patient size and/or use of iterative reconstruction technique. COMPARISON:  Left hip radiograph dated 06/19/2022. FINDINGS: Urinary Tract: The visualized ureters and urinary bladder appear unremarkable. Bowel: Moderate stool throughout the colon. There is sigmoid diverticulosis without active  inflammatory changes. The appendix is normal. Vascular/Lymphatic: Mild aortoiliac atherosclerotic disease. The IVC is unremarkable. No pelvic adenopathy. Reproductive:  Hysterectomy.  Bilateral tubal ligation clips. Other:  None Musculoskeletal: Osteopenia with degenerative changes of the visualized lower lumbar spine. There is severe bilateral hip arthritic changes with bone on bone contact. No acute osseous pathology. IMPRESSION: 1. No acute/traumatic pelvic pathology. 2. Sigmoid diverticulosis. 3. Severe bilateral hip arthritic changes with bone on bone contact. 4.  Aortic Atherosclerosis (ICD10-I70.0). Electronically Signed   By: Elgie Collard M.D.   On: 06/20/2022 00:43   DG HIPS BILAT WITH PELVIS MIN 5 VIEWS  Result Date: 06/19/2022 CLINICAL DATA:  Fall. EXAM: DG HIP (WITH OR WITHOUT PELVIS) 5+V BILAT COMPARISON:  Pelvic radiograph dated 05/26/2020. FINDINGS: Evaluation is limited due to osteopenia and body habitus. There is no acute fracture or dislocation. The bones are osteopenic. Severe bilateral hip arthritic changes with bone on bone contact. There is degenerative changes of the visualized lower lumbar spine. Bilateral tubal ligation clips. The soft tissues are unremarkable. IMPRESSION: 1. No acute fracture or dislocation. 2. Severe bilateral hip arthritic changes. Electronically Signed   By: Elgie Collard M.D.   On: 06/19/2022 23:30   DG Lumbar Spine 2-3 Views  Result Date: 06/19/2022 CLINICAL DATA:  Fall unable to walk EXAM: LUMBAR SPINE - 2-3 VIEW COMPARISON:  None Available. FINDINGS: Five non rib-bearing lumbar type vertebra. Lateral views are limited by cross-table technique and habitus. Vertebral body heights are grossly maintained. Moderate disc space narrowing and degenerative change at L4-L5 and L5-S1 with mild disc space narrowing and degenerative change at L2-L3 and L3-L4. IMPRESSION: Limited study due to habitus and technique. Degenerative changes, most advanced at L4-L5 and  L5-S1. Electronically Signed   By: Jasmine Pang M.D.   On: 06/19/2022 23:28   CT Head Wo Contrast  Result Date: 06/19/2022 CLINICAL DATA:  Neck trauma (Age >= 65y); Head trauma, minor (Age >= 65y) fall. EXAM: CT HEAD WITHOUT CONTRAST CT CERVICAL SPINE WITHOUT CONTRAST TECHNIQUE: Multidetector CT imaging of the head and cervical spine was performed following the standard protocol without intravenous contrast. Multiplanar CT image reconstructions of the cervical spine were also generated.  RADIATION DOSE REDUCTION: This exam was performed according to the departmental dose-optimization program which includes automated exposure control, adjustment of the mA and/or kV according to patient size and/or use of iterative reconstruction technique. COMPARISON:  None Available. FINDINGS: CT HEAD FINDINGS Brain: No acute intracranial hemorrhage. Gray-white differentiation is preserved. No hydrocephalus or extra-axial collection. No mass effect or midline shift. Vascular: No hyperdense vessel or unexpected calcification. Skull: No calvarial fracture or suspicious bone lesion. Skull base is unremarkable. Sinuses/Orbits: Unremarkable. Other: None. CT CERVICAL SPINE FINDINGS Alignment: Normal. Skull base and vertebrae: No acute fracture. Normal craniocervical junction. No suspicious bone lesions. Soft tissues and spinal canal: No prevertebral fluid or swelling. No visible canal hematoma. Disc levels: Mild cervical spondylosis without high-grade spinal canal stenosis. Upper chest: Unremarkable. Other: None. IMPRESSION: 1. No acute intracranial abnormality. 2. No acute cervical spine fracture or traumatic malalignment. Electronically Signed   By: Orvan Falconer M.D.   On: 06/19/2022 20:37   CT Cervical Spine Wo Contrast  Result Date: 06/19/2022 CLINICAL DATA:  Neck trauma (Age >= 65y); Head trauma, minor (Age >= 65y) fall. EXAM: CT HEAD WITHOUT CONTRAST CT CERVICAL SPINE WITHOUT CONTRAST TECHNIQUE: Multidetector CT imaging  of the head and cervical spine was performed following the standard protocol without intravenous contrast. Multiplanar CT image reconstructions of the cervical spine were also generated. RADIATION DOSE REDUCTION: This exam was performed according to the departmental dose-optimization program which includes automated exposure control, adjustment of the mA and/or kV according to patient size and/or use of iterative reconstruction technique. COMPARISON:  None Available. FINDINGS: CT HEAD FINDINGS Brain: No acute intracranial hemorrhage. Gray-white differentiation is preserved. No hydrocephalus or extra-axial collection. No mass effect or midline shift. Vascular: No hyperdense vessel or unexpected calcification. Skull: No calvarial fracture or suspicious bone lesion. Skull base is unremarkable. Sinuses/Orbits: Unremarkable. Other: None. CT CERVICAL SPINE FINDINGS Alignment: Normal. Skull base and vertebrae: No acute fracture. Normal craniocervical junction. No suspicious bone lesions. Soft tissues and spinal canal: No prevertebral fluid or swelling. No visible canal hematoma. Disc levels: Mild cervical spondylosis without high-grade spinal canal stenosis. Upper chest: Unremarkable. Other: None. IMPRESSION: 1. No acute intracranial abnormality. 2. No acute cervical spine fracture or traumatic malalignment. Electronically Signed   By: Orvan Falconer M.D.   On: 06/19/2022 20:37   DG Chest Port 1 View  Result Date: 06/19/2022 CLINICAL DATA:  Weakness EXAM: PORTABLE CHEST 1 VIEW COMPARISON:  12/27/2014 FINDINGS: The heart size and mediastinal contours are within normal limits. Both lungs are clear. The visualized skeletal structures are unremarkable. IMPRESSION: No active disease. Electronically Signed   By: Helyn Numbers M.D.   On: 06/19/2022 19:30               LOS: 0 days   Tamica Covell  [facility]   Pager on [email or web address]. If 7PM-7AM, please contact night-coverage at  [email or web address]     06/21/2022, 9:49 AM

## 2022-06-21 NOTE — [place] FL2 (Signed)
[place] MEDICAID FL2 LEVEL OF CARE FORM     IDENTIFICATION  Patient Name: Becky Perry Birthdate: 1955/09/18 Sex: female Admission Date (Current Location): 06/19/2022  [place] and [place] Number:  Chiropodist and Address:  [facility], [address], [place], [place] [address]      Provider Number: [id number]  Attending Physician Name and Address:  Georga Hacking, MD  Relative Name and Phone Number:  Rubin Payor  [phone]    Current Level of Care: Hospital Recommended Level of Care: Skilled Nursing Facility Prior Approval Number:    Date Approved/Denied:   PASRR Number: [id number]  Discharge Plan: SNF    Current Diagnoses: Patient Active Problem List   Diagnosis Date Noted   Physical debility 06/20/2022   Fall at home, initial encounter 06/20/2022   Bilateral primary osteoarthritis of hip 06/20/2022   Obesity, Class III, BMI 40-49.9 (morbid obesity) (HCC) 06/20/2022   History of opioid abuse (HCC) - in remission. 06/20/2022   Depression 06/20/2022   Diabetes mellitus (HCC) 08/10/2020    Orientation RESPIRATION BLADDER Height & Weight     Self, Time, Situation, Place  Normal Incontinent, External catheter Weight: 280 lb (127 kg) Height:  5\' 6"  (167.6 cm)  BEHAVIORAL SYMPTOMS/MOOD NEUROLOGICAL BOWEL NUTRITION STATUS      Continent Diet (see discharge summary)  AMBULATORY STATUS COMMUNICATION OF NEEDS Skin   Limited Assist Verbally Normal                       Personal Care Assistance Level of Assistance  Bathing, Feeding, Dressing, Total care Bathing Assistance: Limited assistance Feeding assistance: Independent Dressing Assistance: Limited assistance Total Care Assistance: Limited assistance   Functional Limitations Info  Sight, Hearing, Speech Sight Info: Adequate Hearing Info: Adequate Speech Info: Adequate    SPECIAL CARE FACTORS FREQUENCY  PT (By licensed PT), OT (By licensed OT)     PT Frequency:  min 4x weekly OT Frequency: min 4x weekly            Contractures Contractures Info: Not present    Additional Factors Info  Code Status, Allergies Code Status Info: full Allergies Info: Codeine Sulfate  Erythromycin           Current Medications (06/21/2022):  This is the current hospital active medication list Current Facility-Administered Medications  Medication Dose Route Frequency Provider Last Rate Last Admin   acetaminophen (TYLENOL) tablet 1,000 mg  1,000 mg Oral Q6H Carollee Herter, DO   1,000 mg at 06/21/22 1107   amLODipine (NORVASC) tablet 5 mg  5 mg Oral Daily Carollee Herter, DO   5 mg at 06/21/22 8469   buPROPion ER (WELLBUTRIN SR) 12 hr tablet 200 mg  200 mg Oral q morning Carollee Herter, DO   200 mg at 06/21/22 6295   enoxaparin (LOVENOX) injection 62.5 mg  0.5 mg/kg Subcutaneous Q24H Fabienne Bruns M, MD   62.5 mg at 06/20/22 2206   famotidine (PEPCID) tablet 20 mg  20 mg Oral BID Carollee Herter, DO   20 mg at 06/21/22 2841   gabapentin (NEURONTIN) capsule 300 mg  300 mg Oral BID Carollee Herter, DO   300 mg at 06/21/22 3244   ibuprofen (ADVIL) tablet 400 mg  400 mg Oral QID Carollee Herter, DO   400 mg at 06/21/22 1444   ipratropium-albuterol (DUONEB) 0.5-2.5 (3) MG/3ML nebulizer solution 3 mL  3 mL Nebulization Q6H PRN Carollee Herter, DO   3 mL at 06/21/22 1245  melatonin tablet 10 mg  10 mg Oral QHS PRN Carollee Herter, DO       metFORMIN (GLUCOPHAGE) tablet 500 mg  500 mg Oral Q breakfast Carollee Herter, DO   500 mg at 06/21/22 3329   Current Outpatient Medications  Medication Sig Dispense Refill   albuterol (PROVENTIL HFA;VENTOLIN HFA) 108 (90 BASE) MCG/ACT inhaler Inhale 4-6 puffs by mouth every 4 hours as needed for wheezing, cough, and/or shortness of breath 1 Inhaler 1   buPROPion (WELLBUTRIN SR) 200 MG 12 hr tablet Q AM (Patient taking differently: Take 200 mg by mouth every morning. Q AM) 30 tablet 0   gabapentin (NEURONTIN) 300 MG capsule Take 300 mg by mouth daily at 12 noon.      ipratropium (ATROVENT HFA) 17 MCG/ACT inhaler Inhale 2 puffs into the lungs every 6 (six) hours as needed for wheezing.     MAPAP ARTHRITIS PAIN 650 MG CR tablet Take 650 mg by mouth every 8 (eight) hours as needed.     OZEMPIC, 2 MG/DOSE, 8 MG/3ML SOPN Inject 2 mg into the skin once a week.     APO-VARENICLINE 1 MG tablet Take 1 mg by mouth 2 (two) times daily. (Patient not taking: Reported on 06/19/2022)     Biotin 5 MG CAPS Take 5,000 mg by mouth daily at 12 noon. (Patient not taking: Reported on 06/19/2022)     Fluticasone-Salmeterol (ADVAIR) 250-50 MCG/DOSE AEPB Inhale 1 puff into the lungs 2 (two) times daily. (Patient not taking: Reported on 06/19/2022)     Glucosamine-Chondroitin (GLUCOSAMINE CHONDR COMPLEX PO) Take by mouth. (Patient not taking: Reported on 06/19/2022)     ipratropium-albuterol (DUONEB) 0.5-2.5 (3) MG/3ML SOLN Take 3 mLs by nebulization. (Patient not taking: Reported on 06/19/2022)     lisinopril (PRINIVIL,ZESTRIL) 10 MG tablet Take 10 mg by mouth daily. (Patient not taking: Reported on 06/19/2022)     loratadine (CLARITIN) 10 MG tablet Take 10 mg by mouth daily. (Patient not taking: Reported on 06/19/2022)     meloxicam (MOBIC) 15 MG tablet Take 15 mg by mouth daily. (Patient not taking: Reported on 06/19/2022)     metFORMIN (GLUCOPHAGE) 500 MG tablet Dec dose to 250mg  BID (Patient not taking: Reported on 06/19/2022)     Multiple Vitamins-Minerals (CENTRUM SILVER PO) Take by mouth. (Patient not taking: Reported on 06/19/2022)     pravastatin (PRAVACHOL) 40 MG tablet Take 40 mg by mouth daily. (Patient not taking: Reported on 06/19/2022)     Vitamin D, Ergocalciferol, (DRISDOL) 1.25 MG (50000 UNIT) CAPS capsule Take 1 capsule (50,000 Units total) by mouth every 7 (seven) days. (Patient not taking: Reported on 06/19/2022) 4 capsule 0     Discharge Medications: Please see discharge summary for a list of discharge medications.  Relevant Imaging Results:  Relevant Lab  Results:   Additional Information SSN:[id number]  Darolyn Rua, LCSW

## 2022-06-21 NOTE — Evaluation (Signed)
Physical Therapy Evaluation Patient Details Name: Becky Perry MRN: [id number] DOB: [birth year] Today's Date: 06/21/2022  History of Present Illness  67 y.o. female  with history of T2DM presenting to the emergency department for evaluation after a fall.  Patient reports that she has bilateral hip arthritis and is currently attempting weight loss before surgery.  However, she reports that over the past several months she has had significant progressive weakness.  She reports that she spends all day in her recliner, has a portable toilet, but is struggling to take even a few feet to that.  Today, she stood up to try to go to the bathroom, tripped and fell onto her back and hips.  She had to crawl to trying to get help.  She reports significant hip pain.  She is concerned about her ability to care for self at home   Clinical Impression  Pt A&Ox4, reported significant pain in bilateral hips, L > R. Per pt she lives alone and is living in her lift recliner. Has had a significant decline in mobility that last two months, predominantly only stands to pivot to [facility]. Pt agreeable to attempt PT evaluation but very pain dominant. Unable to move LLE without assistance AAROM for RLE. Able to perform a few supine exercise.  Overall the patient demonstrated deficits (see "PT Problem List") that impede the patient's functional abilities, safety, and mobility and would benefit from skilled PT intervention.  Recommendation is to continue skilled PT services to maximize pt function and mobility.       Recommendations for follow up therapy are one component of a multi-disciplinary discharge planning process, led by the attending physician.  Recommendations may be updated based on patient status, additional functional criteria and insurance authorization.  Follow Up Recommendations Can patient physically be transported by private vehicle: No     Assistance Recommended at Discharge Frequent or constant  Supervision/Assistance  Patient can return home with the following  Two people to help with walking and/or transfers;Assistance with cooking/housework;Direct supervision/assist for medications management;Assist for transportation;Help with stairs or ramp for entrance;Two people to help with bathing/dressing/bathroom    Equipment Recommendations Other (comment) (TBD at next venue of care)  Recommendations for Other Services       Functional Status Assessment Patient has had a recent decline in their functional status and demonstrates the ability to make significant improvements in function in a reasonable and predictable amount of time.     Precautions / Restrictions Precautions Precautions: Fall Restrictions Weight Bearing Restrictions: No      Mobility  Bed Mobility               General bed mobility comments: unable to attempt due to elevated pain    Transfers                        Ambulation/Gait                  Stairs            Wheelchair Mobility    Modified Rankin (Stroke Patients Only)       Balance                                             Pertinent Vitals/Pain Pain Assessment Pain Assessment: Faces Faces Pain Scale: Hurts worst Pain Location: B LEs  with touch or any movement attempts Pain Descriptors / Indicators: Discomfort, Grimacing, Guarding, Sharp, Moaning Pain Intervention(s): Limited activity within patient's tolerance, Monitored during session, Repositioned    Home Living Family/patient expects to be discharged to:: Private residence (per social work [facility]) Living Arrangements: Alone Available Help at Discharge: Friend(s);Available PRN/intermittently Type of Home: Apartment Home Access: Level entry       Home Layout: One level Home Equipment: Wheelchair - Forensic psychologist (2 wheels);BSC/3in1 Additional Comments: lift chair    Prior Function Prior Level of Function :  Needs assist             Mobility Comments: Pt has been confined to lift chair and has not ambulates in ~ 2 months. Stand pivot transfers from elevated surfaces. ADLs Comments: Pt reports for the last 2 months she has been sleeping/living in her lift chair. She has only been transferring from lift chair to elevated BSC. 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 Recently stopped HH therapies and PCA secondary to out of visits.     Hand Dominance        Extremity/Trunk Assessment   Upper Extremity Assessment Upper Extremity Assessment: Defer to OT evaluation    Lower Extremity Assessment Lower Extremity Assessment: Generalized weakness (unable to lift either LE due to pain/weakness)       Communication      Cognition Arousal/Alertness: Awake/alert Behavior During Therapy: WFL for tasks assessed/performed Overall Cognitive Status: Within Functional Limits for tasks assessed                                          General Comments      Exercises General Exercises - Lower Extremity Ankle Circles/Pumps: AROM, Both, 10 reps Heel Slides: AAROM, Right, 10 reps, PROM, Left Hip ABduction/ADduction: PROM, Left, 10 reps, AAROM, Right, Strengthening   Assessment/Plan    PT Assessment Patient needs continued PT services  PT Problem List Decreased strength;Decreased mobility;Decreased range of motion;Decreased activity tolerance;Decreased knowledge of use of DME;Pain;Decreased balance       PT Treatment Interventions DME instruction;Therapeutic activities;Gait training;Therapeutic exercise;Patient/family education;Stair training;Balance training;Functional mobility training;Neuromuscular re-education    PT Goals (Current goals can be found in the Care Plan section)  Acute Rehab PT Goals Patient Stated Goal: to have less pain PT Goal Formulation: With patient Time For Goal Achievement: 07/05/22 Potential to Achieve Goals: Good     Frequency Min 2X/week     Co-evaluation               AM-PAC PT "6 Clicks" Mobility  Outcome Measure Help needed turning from your back to your side while in a flat bed without using bedrails?: Total Help needed moving from lying on your back to sitting on the side of a flat bed without using bedrails?: Total Help needed moving to and from a bed to a chair (including a wheelchair)?: Total Help needed standing up from a chair using your arms (e.g., wheelchair or bedside chair)?: Total Help needed to walk in hospital room?: Total Help needed climbing 3-5 steps with a railing? : Total 6 Click Score: 6    End of Session   Activity Tolerance: Patient limited by pain Patient left: in bed;with call bell/phone within reach Nurse Communication: Mobility status PT Visit Diagnosis: Other abnormalities of gait and mobility (R26.89);Pain;Muscle weakness (generalized) (M62.81) Pain - Right/Left:  (bilateral) Pain -  part of body: Hip;Knee    Time: 1610-9604 PT Time Calculation (min) (ACUTE ONLY): 15 min   Charges:   PT Evaluation $PT Eval Low Complexity: 1 Low PT Treatments $Therapeutic Exercise: 8-22 mins       Olga Coaster PT, DPT 11:18 AM,06/21/22

## 2022-06-21 NOTE — TOC Initial Note (Signed)
Transition of Care [facility]) - Initial/Assessment Note    Patient Details  Name: Becky Perry MRN: [id number] Date of Birth: [birth year]  [facility]) CM/SW Contact:    Darolyn Rua, LCSW Phone Number: 06/21/2022, 2:37 PM  Clinical Narrative:                  Patient is from home alone, informed of PT recommendations. Patient is in agreement with SNF , no preference of agency. Is agreeable to CSW sending out local referrals for bed offers.   Pending bed offers at this time.    Barriers to Discharge: Continued Medical Work up   Patient Goals and CMS Choice   CMS Medicare.gov Compare Post Acute Care list provided to:: Patient Choice offered to / list presented to : Patient      Expected Discharge Plan and Services       Living arrangements for the past 2 months: Single Family Home                                      Prior Living Arrangements/Services Living arrangements for the past 2 months: Single Family Home Lives with:: Self                   Activities of Daily Living      Permission Sought/Granted                  Emotional Assessment              Admission diagnosis:  fall Patient Active Problem List   Diagnosis Date Noted   Physical debility 06/20/2022   Fall at home, initial encounter 06/20/2022   Bilateral primary osteoarthritis of hip 06/20/2022   Obesity, Class III, BMI 40-49.9 (morbid obesity) (HCC) 06/20/2022   History of opioid abuse (HCC) - in remission. 06/20/2022   Depression 06/20/2022   Diabetes mellitus (HCC) 08/10/2020   PCP:  Norval Morton, MD Pharmacy:   [facility] - [place], [place] - [address] [place] [address] Phone: [phone] Fax: [phone]     Social Determinants of Health (SDOH) Social History: SDOH Screenings   Depression (PHQ2-9): Medium Risk (07/27/2020)  Tobacco Use: Medium Risk (06/21/2022)   SDOH  Interventions:     Readmission Risk Interventions     No data to display

## 2022-06-21 NOTE — ED Provider Notes (Signed)
    06/21/2022    2:31 AM 06/20/2022   10:00 AM 06/20/2022    9:37 AM  Vitals with BMI  Systolic 137 134 161  Diastolic 70 66 77  Pulse 80 71     There have been no acute events regarding this patient during my shift.  Vitals have remained stable.  She is pending disposition by TOC.   Georga Hacking, MD 06/21/22 [phone]

## 2022-06-21 NOTE — ED Notes (Signed)
Patient states external catheter did not work but states she does not want to get changed yet. States she wants to wait until tylenol starts working.

## 2022-06-21 NOTE — ED Notes (Signed)
Feet cleaned and lotion applied to feet and legs per pt request

## 2022-06-21 NOTE — ED Notes (Signed)
Patient incontinent of urine in bed. Chux changed, peri care performed. New external female catheter applied. Positioned for comfort. Denies additional needs.

## 2022-06-22 DIAGNOSIS — W19XXXA Unspecified fall, initial encounter: Secondary | ICD-10-CM | POA: Diagnosis not present

## 2022-06-22 DIAGNOSIS — M160 Bilateral primary osteoarthritis of hip: Secondary | ICD-10-CM | POA: Diagnosis not present

## 2022-06-22 DIAGNOSIS — Y92009 Unspecified place in unspecified non-institutional (private) residence as the place of occurrence of the external cause: Secondary | ICD-10-CM | POA: Diagnosis not present

## 2022-06-22 NOTE — ED Notes (Signed)
Pt helped off bed pan. Introduced self to patient.

## 2022-06-22 NOTE — ED Notes (Signed)
Pt declined position change

## 2022-06-22 NOTE — ED Notes (Signed)
Pt informed on the importance or turning since she is less mobile. Pt requesting to sit up in bed at this time and agreeable to turning in two hours

## 2022-06-22 NOTE — Progress Notes (Addendum)
Progress Note    Becky Perry  ZOX:[id number] DOB: [birth year]  DOA: 06/19/2022 PCP: Norval Morton, MD      Brief Narrative:    Medical records reviewed and are as summarized below:  Becky Perry is a 67 y.o. 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 Patient has been relegated to sleeping in a recliner for the last 7 weeks due to continued inability to function at home. She has been sleeping in a recliner and using a bedside commode. She has [employer] delivered for her. She also has groceries delivered to her door. She has friends that help put away her groceries. She lives on her first floor apartment. She states that she was trying to use the bedside commode on the day of admission. She stood up from her recliner. She felt pain in her left hip and tried to sit back down on the recliner. She states that she was too close to the front edge of the recliner when she sat down. She ended up falling down on the floor on her buttocks. She urinated on the floor.       Assessment/Plan:   Principal Problem:   Fall at home, initial encounter Active Problems:   Physical debility   Diabetes mellitus (HCC)   Bilateral primary osteoarthritis of hip   Obesity, Class III, BMI 40-49.9 (morbid obesity) (HCC)   History of opioid abuse (HCC) - in remission.   Depression    Body mass index is 45.19 kg/m.  (Morbid obesity)   S/p fall at home fall: No fractures seen.  Vital signs are stable.  There is no indication for hospital admission at this time.  Hospitalist will likely sign off tomorrow if there are no acute issues.    Physical debility: PT and OT recommended discharge to SNF.  Follow-up with TOC to assist with disposition.     Morbid obesity: BMI 45.1.  Complicates overall care & prognosis     Bilateral primary osteoarthritis of hip: She said her surgeon wants her weight to come down before she would be considered for THA.  Continue analgesics as needed for pain.      Type II DM: Continue metformin    Depression: severity unknown. Continue on home dose of wellbutrin     Hx of opioid use disorder: in remission.     Diet Order             Diet regular Room service appropriate? Yes; Fluid consistency: Thin  Diet effective now                            Consultants: None  Procedures: None    Medications:    acetaminophen  1,000 mg Oral Q6H   amLODipine  5 mg Oral Daily   buPROPion  200 mg Oral q morning   enoxaparin (LOVENOX) injection  0.5 mg/kg Subcutaneous Q24H   famotidine  20 mg Oral BID   gabapentin  300 mg Oral BID   ibuprofen  400 mg Oral QID   metFORMIN  500 mg Oral Q breakfast   Continuous Infusions:   Anti-infectives (From admission, onward)    None  Family Communication/Anticipated D/C date and plan/Code Status   DVT prophylaxis: Place and maintain sequential compression device Start: 06/20/22 1435     Code Status: Not on file  Family Communication: None Disposition Plan: Awaiting placement to SNF         Subjective:   Interval events noted.  She still complains of pain in bilateral hips.  She was tearful when I saw her because a little movement in bed causes significant amount of pain.  Objective:    Vitals:   06/21/22 0231 06/21/22 0817 06/21/22 2314 06/22/22 1149  BP: 137/70 128/74 (!) 114/96 134/65  Pulse: 80 72 77 72  Resp: 20 15 16 18   Temp: 98 F (36.7 C) 98.1 F (36.7 C)    TempSrc: Oral Oral    SpO2: 96% 95% 95% 96%  Weight:      Height:       No data found.  No intake or output data in the 24 hours ending 06/22/22 1232 Filed Weights   06/19/22 1824  Weight: 127 kg    Exam:   GEN: No acute distress SKIN: Warm and  dry EYES: EOMI ENT: MMM CV: RRR PULM: CTA B ABD: soft, obese, NT, +BS CNS: AAO x 3, non focal EXT: No edema or tenderness      Data Reviewed:   I have personally reviewed following labs and imaging studies:  Labs: Labs show the following:   Basic Metabolic Panel: Recent Labs  Lab 06/19/22 2124  NA 139  K 3.6  CL 104  CO2 25  GLUCOSE 85  BUN 13  CREATININE 0.81  CALCIUM 9.2   GFR Estimated Creatinine Clearance: 90.7 mL/min (by C-G formula based on SCr of 0.81 mg/dL). Liver Function Tests: Recent Labs  Lab 06/19/22 2124  AST 21  ALT 16  ALKPHOS 76  BILITOT 0.7  PROT 7.6  ALBUMIN 4.0   No results for input(s): "LIPASE", "AMYLASE" in the last 168 hours. No results for input(s): "AMMONIA" in the last 168 hours. Coagulation profile No results for input(s): "INR", "PROTIME" in the last 168 hours.  CBC: Recent Labs  Lab 06/19/22 2124  WBC 8.2  NEUTROABS 5.2  HGB 13.3  HCT 42.3  MCV 93.0  PLT 361   Cardiac Enzymes: Recent Labs  Lab 06/19/22 2124  CKTOTAL 86   BNP (last 3 results) No results for input(s): "PROBNP" in the last 8760 hours. CBG: Recent Labs  Lab 06/19/22 2126  GLUCAP 82   D-Dimer: No results for input(s): "DDIMER" in the last 72 hours. Hgb A1c: No results for input(s): "HGBA1C" in the last 72 hours. Lipid Profile: No results for input(s): "CHOL", "HDL", "LDLCALC", "TRIG", "CHOLHDL", "LDLDIRECT" in the last 72 hours. Thyroid function studies: No results for input(s): "TSH", "T4TOTAL", "T3FREE", "THYROIDAB" in the last 72 hours.  Invalid input(s): "FREET3" Anemia work up: No results for input(s): "VITAMINB12", "FOLATE", "FERRITIN", "TIBC", "IRON", "RETICCTPCT" in the last 72 hours. Sepsis Labs: Recent Labs  Lab 06/19/22 2124  WBC 8.2    Microbiology No results found for this or any previous visit (from the past 240 hour(s)).  Procedures and diagnostic studies:  No results found.             LOS: 0 days    Becky Perry  [facility]   Pager on [email or web address]. If 7PM-7AM, please contact night-coverage at [email or web address]     06/22/2022, 12:32 PM

## 2022-06-22 NOTE — ED Notes (Signed)
Pt eating dinner at this time

## 2022-06-22 NOTE — ED Notes (Signed)
Pt given wipes for bed bath.

## 2022-06-22 NOTE — ED Notes (Signed)
Assisted patient onto bedpan. 

## 2022-06-22 NOTE — TOC Progression Note (Addendum)
Transition of Care [facility]) - Progression Note    Patient Details  Name: ORIA MAYERNIK MRN: [id number] Date of Birth: 08/08/1955  [facility]) CM/SW Contact  Darolyn Rua, [place] Phone Number: 06/22/2022, 9:42 AM  Clinical Narrative:     Update: Bed offers provided, patient [facility]. Facility aware, insurance auth started and approved starting tomorrow 5/24, pending auth ID number. Marcelino Duster at [facility] updated. Plan to discharge tomorrow to [facility].     Patient has no SNF bed offers yet, expanded search and sent out additional referrals to larger geographical area.     Barriers to Discharge: Continued Medical Work up  Expected Discharge Plan and Services       Living arrangements for the past 2 months: Single Family Home                                       Social Determinants of Health (SDOH) Interventions SDOH Screenings   Depression (PHQ2-9): Medium Risk (07/27/2020)  Tobacco Use: Medium Risk (06/21/2022)    Readmission Risk Interventions     No data to display

## 2022-06-22 NOTE — ED Notes (Signed)
Pt given bed bath and repositioned in bed

## 2022-06-22 NOTE — Progress Notes (Signed)
Occupational Therapy Treatment Patient Details Name: Becky Perry MRN: [id number] DOB: 12/02/1955 Today's Date: 06/22/2022   History of present illness 67 y.o. female  with history of T2DM presenting to the emergency department for evaluation after a fall.  Patient reports that she has bilateral hip arthritis and is currently attempting weight loss before surgery.  However, she reports that over the past several months she has had significant progressive weakness.  She reports that she spends all day in her recliner, has a portable toilet, but is struggling to take even a few feet to that.  Today, she stood up to try to go to the bathroom, tripped and fell onto her back and hips.  She had to crawl to trying to get help.  She reports significant hip pain.  She is concerned about her ability to care for self at home   OT comments  Upon entering the room, pt is supine in bed and reports feeling like she is too far down in bed. Use of bed features to Trendelenburg pt in bed bed and she is able to use B UEs on head bed rail to pull herself up in bed with min A from therapist. Focus on B UE strengthening exercises with use of yellow theraband. OT educating and demonstrating use of theraband for bicep curls, chest pulls, shoulder elevation, and shoulder diagonals  2 sets of 10 with min cuing for proper technique. OT leaving theraband in room with pt to utilize on her own. Call bell and all needed items within reach.    Recommendations for follow up therapy are one component of a multi-disciplinary discharge planning process, led by the attending physician.  Recommendations may be updated based on patient status, additional functional criteria and insurance authorization.    Assistance Recommended at Discharge Frequent or constant Supervision/Assistance  Patient can return home with the following  A lot of help with walking and/or transfers;A lot of help with bathing/dressing/bathroom;Assistance with  cooking/housework;Assist for transportation   Equipment Recommendations  None recommended by OT       Precautions / Restrictions Precautions Precautions: Fall       Mobility Bed Mobility                    Transfers                             ADL either performed or assessed with clinical judgement    Extremity/Trunk Assessment Upper Extremity Assessment Upper Extremity Assessment: Generalized weakness   Lower Extremity Assessment Lower Extremity Assessment: Generalized weakness        Vision Patient Visual Report: No change from baseline            Cognition Arousal/Alertness: Awake/alert Behavior During Therapy: WFL for tasks assessed/performed Overall Cognitive Status: Within Functional Limits for tasks assessed                                                     Pertinent Vitals/ Pain       Pain Assessment Pain Assessment: Faces Faces Pain Scale: Hurts whole lot Pain Location: B LEs Pain Descriptors / Indicators: Discomfort, Grimacing, Guarding, Moaning Pain Intervention(s): Limited activity within patient's tolerance, Monitored during session, Repositioned         Frequency  Min  2X/week        Progress Toward Goals  OT Goals(current goals can now be found in the care plan section)  Progress towards OT goals: Progressing toward goals     Plan Discharge plan remains appropriate;Frequency remains appropriate       AM-PAC OT "6 Clicks" Daily Activity     Outcome Measure   Help from another person eating meals?: None Help from another person taking care of personal grooming?: A Little Help from another person toileting, which includes using toliet, bedpan, or urinal?: Total Help from another person bathing (including washing, rinsing, drying)?: A Lot Help from another person to put on and taking off regular upper body clothing?: A Lot Help from another person to put on and taking off regular lower  body clothing?: Total 6 Click Score: 13    End of Session    OT Visit Diagnosis: Unsteadiness on feet (R26.81);Repeated falls (R29.6);Muscle weakness (generalized) (M62.81);History of falling (Z91.81);Pain Pain - Right/Left: Left Pain - part of body: Leg   Activity Tolerance Patient limited by pain   Patient Left in bed;with call bell/phone within reach;with bed alarm set   Nurse Communication Mobility status        Time: 1430-1453 OT Time Calculation (min): 23 min  Charges: OT General Charges $OT Visit: 1 Visit OT Treatments $Therapeutic Exercise: 23-37 mins  Jackquline Denmark, MS, OTR/L , CBIS ascom [phone]  06/22/22, 3:49 PM

## 2022-06-23 NOTE — ED Notes (Signed)
Attempted to call report to [facility] w/o answer.

## 2022-06-23 NOTE — ED Provider Notes (Signed)
-----------------------------------------   4:20 AM on 06/23/2022 -----------------------------------------   Blood pressure 134/65, pulse 72, temperature 97.9 F (36.6 C), temperature source Oral, resp. rate 18, height 5\' 6"  (1.676 m), weight 127 kg, SpO2 96 %.  The patient is in no distress.  There have been no acute events since the last update.  Awaiting disposition plan from case management/social work.    Corena Herter, MD 06/23/22 [phone]

## 2022-06-23 NOTE — ED Notes (Signed)
Pt wanted to attempt to use bedside commode or toilet in room.  No bariatric bedside commode available.  Pt unable to stand w/ nursing and/or walker assistance.  Pt placed on bedpan instead for safety.

## 2022-06-23 NOTE — ED Notes (Signed)
Attempted to call report to [facility] w/ no answer on unit.

## 2022-06-23 NOTE — ED Provider Notes (Signed)
Patient with arrangements to discharge to [facility].  Stable and appropriate for discharge.   Willy Eddy, MD 06/23/22 [phone]

## 2022-06-23 NOTE — ED Notes (Signed)
Pt provided discharge instructions and prescription information. Pt was given the opportunity to ask questions and questions were answered.   Pt transported via [facility] to [facility].

## 2022-06-23 NOTE — TOC Transition Note (Signed)
Transition of Care [facility]) - CM/SW Discharge Note   Patient Details  Name: Becky Perry MRN: [id number] Date of Birth: [birth year]  Transition of Care [facility]) CM/SW Contact:  Darolyn Rua, LCSW Phone Number: 06/23/2022, 8:49 AM   Clinical Narrative:     Patient to discharge to [facility] today, number for report is [phone] Room 808. ED secretary to call [facility], no further dc needs.   Final next level of care: Skilled Nursing Facility Barriers to Discharge: No Barriers Identified   Patient Goals and CMS Choice CMS Medicare.gov Compare Post Acute Care list provided to:: Patient Choice offered to / list presented to : Patient  Discharge Placement                Patient chooses bed at: [facility] Patient to be transferred to facility by: [facility]   Patient and family notified of of transfer: 06/23/22  Discharge Plan and Services Additional resources added to the After Visit Summary for                                       Social Determinants of Health (SDOH) Interventions SDOH Screenings   Depression (PHQ2-9): Medium Risk (07/27/2020)  Tobacco Use: Medium Risk (06/21/2022)     Readmission Risk Interventions     No data to display

## 2023-09-07 ENCOUNTER — Other Ambulatory Visit: Payer: Self-pay | Admitting: Family Medicine

## 2023-09-07 DIAGNOSIS — Z122 Encounter for screening for malignant neoplasm of respiratory organs: Secondary | ICD-10-CM

## 2023-09-07 DIAGNOSIS — Z1231 Encounter for screening mammogram for malignant neoplasm of breast: Secondary | ICD-10-CM

## 2023-09-24 ENCOUNTER — Other Ambulatory Visit: Payer: Self-pay | Admitting: Family Medicine

## 2023-09-24 DIAGNOSIS — Z13820 Encounter for screening for osteoporosis: Secondary | ICD-10-CM

## 2023-09-25 ENCOUNTER — Ambulatory Visit (HOSPITAL_COMMUNITY)

## 2023-10-10 ENCOUNTER — Ambulatory Visit

## 2023-10-30 ENCOUNTER — Ambulatory Visit
Admission: RE | Admit: 2023-10-30 | Discharge: 2023-10-30 | Disposition: A | Discharge status: Home / Self Care | Source: Ambulatory Visit | Attending: Family Medicine | Admitting: Family Medicine

## 2023-10-30 ENCOUNTER — Ambulatory Visit
Admission: RE | Admit: 2023-10-30 | Discharge: 2023-10-30 | Disposition: A | Source: Ambulatory Visit | Attending: Family Medicine | Admitting: Family Medicine

## 2023-10-30 DIAGNOSIS — Z13820 Encounter for screening for osteoporosis: Secondary | ICD-10-CM | POA: Insufficient documentation

## 2023-10-30 DIAGNOSIS — Z1231 Encounter for screening mammogram for malignant neoplasm of breast: Secondary | ICD-10-CM | POA: Diagnosis present

## 2023-10-31 ENCOUNTER — Other Ambulatory Visit: Payer: Self-pay | Admitting: *Deleted

## 2023-10-31 ENCOUNTER — Inpatient Hospital Stay
Admission: RE | Admit: 2023-10-31 | Discharge: 2023-10-31 | Disposition: A | Discharge status: Home / Self Care | Payer: Self-pay | Source: Ambulatory Visit | Attending: Family Medicine | Admitting: Family Medicine

## 2023-10-31 DIAGNOSIS — Z1231 Encounter for screening mammogram for malignant neoplasm of breast: Secondary | ICD-10-CM

## 2023-12-07 NOTE — ED Provider Notes (Signed)
 [facility] Emergency Department Provider Note    ED Clinical Impression    Final diagnoses:  Cellulitis of left leg (Primary)  Wound of left leg, initial encounter        Impression, Medical Decision Making, Progress Notes and Critical Care    Impression, Differential Diagnosis and Plan of Care  Medical Decision Making A 68 year old female with a history of type 2 diabetes mellitus presented with a two-week history of a worsening blister and sore on her right lower leg, following prior swelling and difficulty with wound care due to limited mobility. Examination revealed a 5x3 cm area of scant blistering, induration, erythema, serous drainage, and fibrinous tissue at the wound margins, without systemic signs of infection or sepsis. Peripheral pulses were intact, compartments were soft, and there was no evidence of lymphangitis or crepitus.  Differential diagnosis includes, but is not limited to: - Cellulitis: Cellulitis was considered due to localized erythema, induration, warmth, and serous drainage, with no systemic infection or sepsis present. - Osteomyelitis: Osteomyelitis was considered and ruled out as unlikely based on clinical exam, with x-ray ordered to definitively exclude bone involvement. - necrotizing fasciitis: Soft tissue decay was considered but deemed low suspicion due to absence of crepitus, systemic toxicity, or rapid progression.  Cellulitis of right lower leg - Initiate antibiotics - Order CBC and CMP - Order x-ray of the right lower leg to rule out bone involvement - Refer to wound care center for periodic rechecks - Educated patient on proper wound care: use soap and water, avoid strong cleansers and scraping scabs  Type 2 diabetes mellitus - Continue current diabetes medications  Review of labs and imaging: No evidence of significant leukocytosis, hyperglycemia, electrolyte derangement, or acid-base imbalance.  No evidence of DKA in this  patient and overall no evidence of sepsis.  X-ray shows no evidence of bony involvement or subcutaneous gas.  Examination shows no evidence of necrotizing fasciitis or abscess.  Low suspicion of osteomyelitis in this patient.  Initial treatment provided with IV and oral antibiotic, and the patient was instructed at length with regard to wound care, and close outpatient follow-up with wound care center.  Discussed strict return precautions for any development of hyperglycemia over 300, recurrent vomiting, fever, or any other concerning symptom.  The patient verbalized understanding and agreed.  Wound care and bandage performed by nursing staff prior to discharge.  Patient discharged in stable condition.  Disclaimers: -Portions of this record have been created using Scientist, clinical (histocompatibility and immunogenetics). Dictation errors have been sought, but may not have been identified and corrected. -This note was created in part using the AI scribe software Abridge. While efforts have been made to review and edit the content for accuracy, there may be errors, omissions, or misinterpretations due to automated transcription or summarization.   See chart and resident provider documentation for details.  ____________________________________________     HISTORY     Reason for Visit Wound Check   HPI  History of Present Illness Becky Perry is a 68 year old female who presents with a sore on her leg.  A blister on her leg appeared approximately one and a half to two weeks ago, burst, and has since worsened. 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 Approximately two months ago, she experienced significant leg swelling, initially thought to be an allergic reaction, which preceded the blister. She applies paper towels soaked in alcohol to the sore but struggles  to clean it effectively. There is no fever, vomiting, or body aches. Pain is localized to the shin without  radiation. She is on medication for diabetes but does not regularly check her blood sugar levels, though she takes her medications as prescribed.    Past Medical History[1]  Problem List[2]  Past Surgical History[3]  No current facility-administered medications for this encounter.  Current Outpatient Medications:  .  cephalexin (KEFTAB) 500 mg tablet, Take 1 tablet (500 mg total) by mouth every six (6) hours for 7 days., Disp: 28 tablet, Rfl: 0 .  doxycycline (VIBRAMYCIN) 100 MG capsule, Take 1 capsule (100 mg total) by mouth two (2) times a day for 7 days., Disp: 14 capsule, Rfl: 0  Allergies Codeine sulfate and Erythromycin  Family History[4]  Social History Short Social History[5]   PHYSICAL EXAM    ED Triage Vitals  Enc Vitals Group     BP 12/07/23 1007 121/92     Pulse 12/07/23 1007 106     SpO2 Pulse --      Resp 12/07/23 1007 18     Temp 12/07/23 1007 36.6 C (97.9 F)     Temp Source 12/07/23 1007 Oral     SpO2 12/07/23 1007 100 %     Weight 12/07/23 1024 (!) 162.4 kg (358 lb 1.6 oz)     Height --      Head Circumference --      Peak Flow --      Pain Score --      Pain Loc --      Pain Education --      Exclude from Growth Chart --     Physical Exam GENERAL: Alert, cooperative, well developed, no acute distress. HEENT: Normocephalic, normal oropharynx, moist mucous membranes. EXTREMITIES: No cyanosis or edema. 5x3 cm area of scant blistering with surrounding induration and erythema on leg. Serous drainage, no purulent drainage. Wound margins with fibrinous tissue. No streaking lymphangitis. Compartments are soft. PT pulses 2+. No crepitus. No reflux. Area is slightly warm.  NEUROLOGICAL: Cranial nerves grossly intact, moves all extremities without gross motor or sensory deficit.   RESULTS    Labs   Results for orders placed or performed during the hospital encounter of 12/07/23  Comprehensive Metabolic Panel  Result Value Ref Range   Sodium 147  (H) 135 - 145 mmol/L   Potassium 4.4 3.4 - 4.8 mmol/L   Chloride 102 98 - 107 mmol/L   CO2 30.4 20.0 - 31.0 mmol/L   Anion Gap 15 (H) 5 - 14 mmol/L   BUN 15 9 - 23 mg/dL   Creatinine 8.78 (H) 9.44 - 1.02 mg/dL   BUN/Creatinine Ratio 12    eGFR CKD-EPI (2021) Female 49 (L) >=60 mL/min/1.31m2   Glucose 122 70 - 179 mg/dL   Calcium 9.5 8.7 - 89.5 mg/dL   Albumin 3.7 3.4 - 5.0 g/dL   Total Protein 7.7 5.7 - 8.2 g/dL   Total Bilirubin 0.4 0.3 - 1.2 mg/dL   AST 17 <=65 U/L   ALT 11 10 - 49 U/L   Alkaline Phosphatase 91 46 - 116 U/L  POCT Glucose  Result Value Ref Range   Glucose, POC 120 70 - 179 mg/dL  CBC w/ Differential  Result Value Ref Range   WBC 5.8 3.6 - 11.2 10*9/L   RBC 3.92 (L) 3.95 - 5.13 10*12/L   HGB 12.0 11.3 - 14.9 g/dL   HCT 64.0 65.9 - 55.9 %   MCV 91.7  77.6 - 95.7 fL   MCH 30.6 25.9 - 32.4 pg   MCHC 33.4 32.0 - 36.0 g/dL   RDW 85.9 87.7 - 84.7 %   MPV 6.4 (L) 6.8 - 10.7 fL   Platelet 266 150 - 450 10*9/L   nRBC 0 <=4 /100 WBCs   Neutrophils % 65.9 %   Lymphocytes % 23.1 %   Monocytes % 7.5 %   Eosinophils % 2.8 %   Basophils % 0.7 %   Absolute Neutrophils 3.9 1.8 - 7.8 10*9/L   Absolute Lymphocytes 1.3 1.1 - 3.6 10*9/L   Absolute Monocytes 0.4 0.3 - 0.8 10*9/L   Absolute Eosinophils 0.2 0.0 - 0.5 10*9/L   Absolute Basophils 0.0 0.0 - 0.1 10*9/L     Radiology   XR Tibia Fibula Left Result Date: 12/07/2023 EXAM: XR TIBIA FIBULA LEFT DATE: 12/07/2023 10:52 AM ACCESSION: [id number] UN DICTATED: 12/07/2023 10:55 AM INTERPRETATION LOCATION: [facility] CLINICAL INDICATION: 68 years old Female with cellulitis left leg    COMPARISON: None. TECHNIQUE: AP and lateral views of the left tibia and fibula. FINDINGS: No osseous erosions. No fracture. No malalignment. Moderate diffuse subcutaneous soft tissue swelling. 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   No acute osseous abnormalities. Moderate diffuse subcutaneous soft tissue swelling.   Medications  administered this visit   @MEDADMIN @ Procedures            [1] No past medical history on file. [2] There is no problem list on file for this patient. [3] Past Surgical History: Procedure Laterality Date  . BREAST BIOPSY    . HYSTERECTOMY  2003  [4] No family history on file. [5]    Georjean Falling [place], [place] 12/07/23 1141

## 2023-12-09 ENCOUNTER — Emergency Department

## 2023-12-09 ENCOUNTER — Other Ambulatory Visit: Payer: Self-pay

## 2023-12-09 ENCOUNTER — Encounter: Payer: Self-pay | Admitting: Emergency Medicine

## 2023-12-09 ENCOUNTER — Emergency Department: Admission: EM | Admit: 2023-12-09 | Discharge: 2023-12-09 | Disposition: A | Discharge status: Home / Self Care

## 2023-12-09 DIAGNOSIS — L03116 Cellulitis of left lower limb: Secondary | ICD-10-CM | POA: Insufficient documentation

## 2023-12-09 DIAGNOSIS — R2242 Localized swelling, mass and lump, left lower limb: Secondary | ICD-10-CM | POA: Diagnosis present

## 2023-12-09 DIAGNOSIS — E119 Type 2 diabetes mellitus without complications: Secondary | ICD-10-CM | POA: Diagnosis not present

## 2023-12-09 LAB — COMPREHENSIVE METABOLIC PANEL WITH GFR
ALT: 12 U/L (ref 0–44)
AST: 22 U/L (ref 15–41)
Albumin: 3.8 g/dL (ref 3.5–5.0)
Alkaline Phosphatase: 77 U/L (ref 38–126)
Anion gap: 12 (ref 5–15)
BUN: 21 mg/dL (ref 8–23)
CO2: 27 mmol/L (ref 22–32)
Calcium: 9.3 mg/dL (ref 8.9–10.3)
Chloride: 102 mmol/L (ref 98–111)
Creatinine, Ser: 1.23 mg/dL — ABNORMAL HIGH (ref 0.44–1.00)
GFR, Estimated: 48 mL/min — ABNORMAL LOW (ref >60)
Glucose, Bld: 97 mg/dL (ref 70–99)
Potassium: 4.4 mmol/L (ref 3.5–5.1)
Sodium: 141 mmol/L (ref 135–145)
Total Bilirubin: 0.7 mg/dL (ref 0.0–1.2)
Total Protein: 8.5 g/dL — ABNORMAL HIGH (ref 6.5–8.1)

## 2023-12-09 LAB — CBC WITH DIFFERENTIAL/PLATELET
Abs Immature Granulocytes: 0.01 K/uL (ref 0.00–0.07)
Basophils Absolute: 0 K/uL (ref 0.0–0.1)
Basophils Relative: 1 %
Eosinophils Absolute: 0.1 K/uL (ref 0.0–0.5)
Eosinophils Relative: 3 %
HCT: 38.3 % (ref 36.0–46.0)
Hemoglobin: 12 g/dL (ref 12.0–15.0)
Immature Granulocytes: 0 %
Lymphocytes Relative: 32 %
Lymphs Abs: 1.5 K/uL (ref 0.7–4.0)
MCH: 29.6 pg (ref 26.0–34.0)
MCHC: 31.3 g/dL (ref 30.0–36.0)
MCV: 94.6 fL (ref 80.0–100.0)
Monocytes Absolute: 0.4 K/uL (ref 0.1–1.0)
Monocytes Relative: 9 %
Neutro Abs: 2.6 K/uL (ref 1.7–7.7)
Neutrophils Relative %: 55 %
Platelets: 265 K/uL (ref 150–400)
RBC: 4.05 MIL/uL (ref 3.87–5.11)
RDW: 13.4 % (ref 11.5–15.5)
WBC: 4.7 K/uL (ref 4.0–10.5)
nRBC: 0 % (ref 0.0–0.2)

## 2023-12-09 LAB — LACTIC ACID, PLASMA: Lactic Acid, Venous: 1.9 mmol/L (ref 0.5–1.9)

## 2023-12-09 MED ORDER — DOXYCYCLINE HYCLATE 100 MG PO TABS: 100.0000 mg | ORAL_TABLET | Freq: Once | ORAL | Status: DC

## 2023-12-09 NOTE — Discharge Instructions (Signed)
 You were seen in the emergency department for a left leg wound.  I would continue the antibiotics you are already prescribed by [facility].  Elevate your leg while sleeping.  Follow-up with your primary care physician.  Be reassured that today we do not see signs of a larger infection and this seems to be localized and we do not see signs of a blood clot.  It may take up to 72 hours after antibiotic initiation for the antibiotics to reach efficacy in your bloodstream but you should start seeing an improvement after 5 to 6 days.  If not, follow-up with your primary care physician as longer courses of antibiotics may be necessary.  It was very nice meeting you and I wish you the best of luck. -- RETURN PRECAUTIONS & AFTERCARE: (ENGLISH) RETURN PRECAUTIONS: Return immediately to the emergency department or see/call your doctor if you feel worse, weak or have changes in speech or vision, are short of breath, have fever, vomiting, pain, bleeding or dark stool, trouble urinating or any new issues. Return here or see/call your doctor if not improving as expected for your suspected condition. FOLLOW-UP CARE: Call your doctor and/or any doctors we referred you to for more advice and to make an appointment. Do this today, tomorrow or after the weekend. Some doctors only take PPO insurance so if you have HMO insurance you may want to contact your HMO or your regular doctor for referral to a specialist within your plan. Either way tell the doctor's office that it was a referral from the emergency department so you get the soonest possible appointment.  YOUR TEST RESULTS: Take result reports of any blood or urine tests, imaging tests and EKG's to your doctor and any referral doctor. Have any abnormal tests repeated. Your doctor or a referral doctor can let you know when this should be done. Also make sure your doctor contacts this hospital to get any test results that are not currently available such as cultures or special tests  for infection and final imaging reports, which are often not available at the time you leave the ER but which may list additional important findings that are not documented on the preliminary report. BLOOD PRESSURE: If your blood pressure was greater than 120/80 have your blood pressure rechecked within 1 to 2 weeks. MEDICATION SIDE EFFECTS: Do not drive, walk, bike, take the bus, etc. if you have received or are being prescribed any sedating medications such as those for pain or anxiety or certain antihistamines like Benadryl. If you have been give one of these here get a taxi home or have a friend drive you home. Ask your pharmacist to counsel you on potential side effects of any new medication

## 2023-12-09 NOTE — ED Triage Notes (Signed)
 Patient brought in by wheelchair by POV with wound to left lower leg. Patients friend states he has been coming over and cleaning them and redressing them. Noticed this morning having drainage from it. Was seen at a different ED 2 days ago and given IV antibiotics and sent home with oral antibiotics.

## 2023-12-09 NOTE — ED Provider Notes (Signed)
 [facility] Provider Note    Event Date/Time   First MD Initiated Contact with Patient 12/09/23 1419     (approximate)   History   Wound Check   HPI  Becky Perry is a 68 y.o. female  with type II diabetes, morbid obesity, chronic hip pain who presents with 1 week of progressively worsening left lower extremity swelling and wound.  Patient states that area began with a blister on her left lower leg and developed worsening pain and erythema and subsequently had written.  She was seen at [facility] on 12/07/2023 (2 days ago) and was prescribed a course of doxycycline and Keflex which she initiated the day afterwards.  She has had a total of 24 hours of antibiotics.  Her friend who is a nurse went and checked on hold to help to dressing change and became concerned that there was some increased discharge to the area and brought her back to our facility.  Of note patient reports a year-long case of worsening disability and wonders whether she could get home health care.  Denies any chest pain shortness of breath abdominal pain, new extremity weakness or sensation changes      Physical Exam   Triage Vital Signs: ED Triage Vitals  Encounter Vitals Group     BP 12/09/23 1220 135/83     Girls Systolic BP Percentile --      Girls Diastolic BP Percentile --      Boys Systolic BP Percentile --      Boys Diastolic BP Percentile --      Pulse Rate 12/09/23 1220 80     Resp 12/09/23 1220 18     Temp 12/09/23 1220 97.7 F (36.5 C)     Temp Source 12/09/23 1220 Oral     SpO2 12/09/23 1220 95 %     Weight 12/09/23 1222 280 lb (127 kg)     Height 12/09/23 1222 5' 6 (1.676 m)     Head Circumference --      Peak Flow --      Pain Score --      Pain Loc --      Pain Education --      Exclude from Growth Chart --     Most recent vital signs: Vitals:   12/09/23 1220 12/09/23 1445  BP: 135/83   Pulse: 80   Resp: 18   Temp: 97.7 F (36.5 C)   SpO2: 95% 95%     Nursing Triage Note reviewed. Vital signs reviewed and patients oxygen saturation is normoxic  General: Patient is well nourished, well developed, awake and alert, resting comfortably in no acute distress Head: Normocephalic and atraumatic Eyes: Normal inspection, extraocular muscles intact, no conjunctival pallor Ear, nose, throat: Normal external exam Neck: Normal range of motion Respiratory: Patient is in no respiratory distress, lungs CTAB Cardiovascular: Patient is not tachycardic, RRR without murmur appreciated GI: Abd SNT with no guarding or rebound  Back: Normal inspection of the back with good strength and range of motion throughout all ext Extremities: pulses intact with good cap refills, left lower extremity with 1+ edema and mild tenderness without any surrounding erythema in the following lesion:   Neuro: The patient is alert and oriented to person, place, and time, appropriately conversive, with 5/5 bilat UE/LE strength, no gross motor or sensory defects noted. Coordination appears to be adequate. Psych: normal mood and affect, no SI or HI  ED Results / Procedures / Treatments  Labs (all labs ordered are listed, but only abnormal results are displayed) Labs Reviewed  COMPREHENSIVE METABOLIC PANEL WITH GFR - Abnormal; Notable for the following components:      Result Value   Creatinine, Ser 1.23 (*)    Total Protein 8.5 (*)    GFR, Estimated 48 (*)    All other components within normal limits  LACTIC ACID, PLASMA  CBC WITH DIFFERENTIAL/PLATELET     EKG None  RADIOLOGY Tibia-fibula x-ray: No gas tracking and only soft tissue edema my independent review interpretation radiologist agrees Venous Doppler: No DVT    PROCEDURES:  Critical Care performed: No  Procedures   MEDICATIONS ORDERED IN ED: Medications - No data to display   IMPRESSION / MDM / ASSESSMENT AND PLAN / ED COURSE                                Differential diagnosis includes,  but is not limited to, cellulitis, DVT, sepsis, DKA, electrolyte derangement  ED course: Patient is afebrile and not tachycardic.  She had no leukocytosis and her lactic acid was 1.9.  She was not profoundly anemic.  She had no profound electrolyte derangements and no anion gap making DKA unlikely.  An x-ray demonstrated no gas tracking and a Doppler ultrasound demonstrated no underlying DVT.  I do not think her case is consistent with sepsis at this moment.  I also do not think her case is consistent with any treatment failure of the antibiotics as she has been on them for less than 24 hours.  She was counseled on wound care and elevation.  Unfortunately she presented on a Sunday afternoon and we do not have social work readily available.  She has a primary care physician and will need to follow-up with them anyway for wound recheck and to ensure that the course of antibiotics do not need to be extended.  She feels comfortable doing this and she will discuss the issue of home health care at her next visit.  All questions answered and patient and friend voiced understanding and she requested discharge   Clinical Course as of 12/09/23 2031  [place] Dec 09, 2023  1429 Anion gap: 12 No DKA [HD]  1544 US  Venous Img Lower Unilateral Left No DVT [HD]    Clinical Course User Index [HD] Nicholaus Rolland BRAVO, MD   At time of discharge there is no evidence of acute life, limb, vision, or fertility threat. Patient has stable vital signs, pain is well controlled, and p.o. tolerant.  Discharge instructions were completed using the EPIC system. I would refer you to those at this time. All warnings prescriptions follow-up etc. were discussed in detail with the patient. Patient indicates understanding and is agreeable with this plan. All questions answered.  Patient is made aware that they may return to the emergency department for any worsening or new condition or for any other emergency.  -- Risk: 5 This patient  has a high risk of morbidity due to further diagnostic testing or treatment. Rationale: This patient's evaluation and management involve a high risk of morbidity due to the potential severity of presenting symptoms, need for diagnostic testing, and/or initiation of treatment that may require close monitoring. The differential includes conditions with potential for significant deterioration or requiring escalation of care. Treatment decisions in the ED, including medication administration, procedural interventions, or disposition planning, reflect this level of risk. COPA: 5 The patient has the  following acute or chronic illness/injury that poses a possible threat to life or bodily function: [X] : The patient has a potentially serious acute condition or an acute exacerbation of a chronic illness requiring urgent evaluation and management in the Emergency Department. The clinical presentation necessitates immediate consideration of life-threatening or function-threatening diagnoses, even if they are ultimately ruled out.   FINAL CLINICAL IMPRESSION(S) / ED DIAGNOSES   Final diagnoses:  Left leg cellulitis     Rx / DC Orders   ED Discharge Orders     None        Note:  This document was prepared using Dragon voice recognition software and may include unintentional dictation errors.   Nicholaus Rolland BRAVO, MD 12/09/23 2031

## 2024-01-21 ENCOUNTER — Encounter: Admitting: Physician Assistant
